# Patient Record
Sex: Male | Born: 1980 | Race: Black or African American | Hispanic: No | Smoking: Never smoker
Health system: Southern US, Community
[De-identification: ages and names within clinical notes are randomized; demographics above are authoritative.]

## PROBLEM LIST (undated history)

## (undated) DIAGNOSIS — E119 Type 2 diabetes mellitus without complications: Secondary | ICD-10-CM

## (undated) DIAGNOSIS — M5416 Radiculopathy, lumbar region: Secondary | ICD-10-CM

## (undated) DIAGNOSIS — I10 Essential (primary) hypertension: Secondary | ICD-10-CM

## (undated) DIAGNOSIS — N189 Chronic kidney disease, unspecified: Secondary | ICD-10-CM

## (undated) HISTORY — DX: Chronic kidney disease, unspecified: N18.9

## (undated) HISTORY — DX: Type 2 diabetes mellitus without complications: E11.9

## (undated) HISTORY — PX: SHOULDER SURGERY: SHX246

## (undated) HISTORY — DX: Essential (primary) hypertension: I10

## (undated) HISTORY — DX: Radiculopathy, lumbar region: M54.16

---

## 2002-03-31 ENCOUNTER — Emergency Department (HOSPITAL_COMMUNITY): Admission: EM | Admit: 2002-03-31 | Discharge: 2002-03-31 | Payer: Self-pay | Admitting: Emergency Medicine

## 2002-03-31 ENCOUNTER — Encounter: Payer: Self-pay | Admitting: Emergency Medicine

## 2014-05-09 ENCOUNTER — Emergency Department: Payer: Self-pay | Admitting: Emergency Medicine

## 2014-05-09 LAB — COMPREHENSIVE METABOLIC PANEL
Albumin: 4.4 g/dL (ref 3.4–5.0)
Alkaline Phosphatase: 110 U/L
Anion Gap: 11 (ref 7–16)
BUN: 18 mg/dL (ref 7–18)
Bilirubin,Total: 0.5 mg/dL (ref 0.2–1.0)
Calcium, Total: 9.4 mg/dL (ref 8.5–10.1)
Chloride: 91 mmol/L — ABNORMAL LOW (ref 98–107)
Co2: 28 mmol/L (ref 21–32)
Creatinine: 1.64 mg/dL — ABNORMAL HIGH (ref 0.60–1.30)
EGFR (African American): >60
EGFR (Non-African Amer.): 55 — ABNORMAL LOW
Glucose: 635 mg/dL (ref 65–99)
Osmolality: 293 (ref 275–301)
Potassium: 4.6 mmol/L (ref 3.5–5.1)
SGOT(AST): 19 U/L (ref 15–37)
SGPT (ALT): 42 U/L
Sodium: 130 mmol/L — ABNORMAL LOW (ref 136–145)
Total Protein: 8.1 g/dL (ref 6.4–8.2)

## 2014-05-09 LAB — URINALYSIS, COMPLETE
Bacteria: NONE SEEN
Bilirubin,UR: NEGATIVE
Blood: NEGATIVE
Glucose,UR: >=500 mg/dL (ref 0–75)
Leukocyte Esterase: NEGATIVE
Nitrite: NEGATIVE
Ph: 5 (ref 4.5–8.0)
Protein: NEGATIVE
RBC,UR: <1 /HPF (ref 0–5)
Specific Gravity: 1.03 (ref 1.003–1.030)
Squamous Epithelial: NONE SEEN
WBC UR: NONE SEEN /HPF (ref 0–5)

## 2014-05-09 LAB — CBC WITH DIFFERENTIAL/PLATELET
Basophil #: 0 10*3/uL (ref 0.0–0.1)
Basophil %: 0.9 %
Eosinophil #: 0 10*3/uL (ref 0.0–0.7)
Eosinophil %: 1.1 %
HCT: 47.4 % (ref 40.0–52.0)
HGB: 16.1 g/dL (ref 13.0–18.0)
Lymphocyte #: 1.6 10*3/uL (ref 1.0–3.6)
Lymphocyte %: 36 %
MCH: 30.1 pg (ref 26.0–34.0)
MCHC: 34 g/dL (ref 32.0–36.0)
MCV: 89 fL (ref 80–100)
Monocyte #: 0.3 x10 3/mm (ref 0.2–1.0)
Monocyte %: 7.6 %
Neutrophil #: 2.4 10*3/uL (ref 1.4–6.5)
Neutrophil %: 54.4 %
Platelet: 182 10*3/uL (ref 150–440)
RBC: 5.34 10*6/uL (ref 4.40–5.90)
RDW: 11.6 % (ref 11.5–14.5)
WBC: 4.5 10*3/uL (ref 3.8–10.6)

## 2014-05-09 LAB — HEMOGLOBIN A1C: Hemoglobin A1C: 12 % — ABNORMAL HIGH (ref 4.2–6.3)

## 2015-03-20 ENCOUNTER — Telehealth: Payer: Self-pay | Admitting: Family Medicine

## 2015-03-20 ENCOUNTER — Other Ambulatory Visit: Payer: Self-pay | Admitting: Family Medicine

## 2015-03-20 NOTE — Telephone Encounter (Signed)
Please contact patient and inform him that the [employer] form is requesting results of lab work done recently. I do not have any lab work results on him. Has he been following with his Endocrinologist for his Diabetes? I have not received any notes from his Endocrinologist. And does he have recent lab work results done through the Endocrinologist that I can review? If not he will need to f/u with me in office so I can order lab work and fill out the [employer] Forms. His last physical was 05/2014 so he can follow up a Pop early as a physical exam if he wants or just a routine follow up is okay as well.

## 2015-03-20 NOTE — Telephone Encounter (Signed)
Contacted patient to inform him that since we do not have any recent lab results on him that we cannot complete the form that his employer is requested. Patient was asked if he has been to his endocrinologist and he said that he has not been there in awhile. Patient was then asked if he wanted to come in for a follow-up or for his annual physical. Patient decided to come in just for a follow-up. Patient was scheduled for this Wednesday (03/22/15) at 9:45am.

## 2015-03-22 ENCOUNTER — Encounter: Payer: Self-pay | Admitting: Family Medicine

## 2015-03-22 ENCOUNTER — Other Ambulatory Visit: Payer: Self-pay | Admitting: Family Medicine

## 2015-03-22 ENCOUNTER — Ambulatory Visit (INDEPENDENT_AMBULATORY_CARE_PROVIDER_SITE_OTHER): Payer: 59 | Admitting: Family Medicine

## 2015-03-22 VITALS — BP 152/90 | HR 79 | Temp 98.2°F | Resp 18 | Ht 69.0 in | Wt 218.0 lb

## 2015-03-22 DIAGNOSIS — E559 Vitamin D deficiency, unspecified: Secondary | ICD-10-CM | POA: Diagnosis not present

## 2015-03-22 DIAGNOSIS — E1029 Type 1 diabetes mellitus with other diabetic kidney complication: Secondary | ICD-10-CM | POA: Insufficient documentation

## 2015-03-22 DIAGNOSIS — N182 Chronic kidney disease, stage 2 (mild): Secondary | ICD-10-CM | POA: Insufficient documentation

## 2015-03-22 DIAGNOSIS — E1065 Type 1 diabetes mellitus with hyperglycemia: Principal | ICD-10-CM

## 2015-03-22 DIAGNOSIS — E1329 Other specified diabetes mellitus with other diabetic kidney complication: Secondary | ICD-10-CM

## 2015-03-22 DIAGNOSIS — E669 Obesity, unspecified: Secondary | ICD-10-CM | POA: Insufficient documentation

## 2015-03-22 DIAGNOSIS — IMO0001 Reserved for inherently not codable concepts without codable children: Secondary | ICD-10-CM

## 2015-03-22 DIAGNOSIS — I10 Essential (primary) hypertension: Secondary | ICD-10-CM | POA: Diagnosis not present

## 2015-03-22 DIAGNOSIS — IMO0002 Reserved for concepts with insufficient information to code with codable children: Secondary | ICD-10-CM

## 2015-03-22 LAB — POCT UA - MICROALBUMIN: Microalbumin Ur, POC: 20 mg/L

## 2015-03-22 LAB — HEMOGLOBIN A1C: HEMOGLOBIN A1C: 9.2 % — AB (ref 4.0–6.0)

## 2015-03-22 LAB — POCT GLYCOSYLATED HEMOGLOBIN (HGB A1C): HEMOGLOBIN A1C: 9.2

## 2015-03-22 MED ORDER — TOUJEO SOLOSTAR 300 UNIT/ML ~~LOC~~ SOPN: 25.0000 [IU] | PEN_INJECTOR | Freq: Every day | SUBCUTANEOUS | Status: DC

## 2015-03-22 MED ORDER — LISINOPRIL 20 MG PO TABS: 20.0000 mg | ORAL_TABLET | Freq: Every day | ORAL | Status: DC

## 2015-03-22 NOTE — Progress Notes (Signed)
Name: William Valdez   MRN: [id number]    DOB: [birth year]   Date:03/22/2015       Progress Note  Subjective  Chief Complaint  Chief Complaint  Patient presents with  . Follow-up    HPI  Patient is here for routine follow up of Diabetes Type I.  Current diabetes medication regimen includesToujeo and Humalog. Patient is taking medications as instructed. Overall the patient feels that their blood glucose is not well controlled. Checking blood glucose 1-2 times a day inconsistently. Has not been following up with Endocrinologist due to bill issues. Fasting glucose recently 150-200mg /dL and supper mealtime glucose 200s.   Hypertension: Patient is here for evaluation of elevated blood pressures.Cardiac symptoms none. Patient denies none.  Cardiovascular risk factors: diabetes mellitus, dyslipidemia, hypertension, male gender, microalbuminuria and obesity (BMI >= 30 kg/m2). Use of agents associated with hypertension: none. History of target organ damage: chronic kidney disease.    Patient Active Problem List   Diagnosis Date Noted  . 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 Obesity, Class II, BMI 35-39.9, with comorbidity 03/22/2015  . CKD (chronic kidney disease), 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  . Vitamin D deficiency 03/22/2015    History  Substance Use Topics  . Smoking status: Never Smoker   . Smokeless tobacco: Not on file  . Alcohol Use: No     Current outpatient prescriptions:  .  ACCU-CHEK AVIVA PLUS test strip, 1 Device by Does not apply route 4 (four) times daily., Disp: , Rfl: 11 .  BD ULTRA-FINE PEN NEEDLES 29G X 12.7MM MISC, 1 Device by Does not apply route 2 (two) times daily., Disp: , Rfl: 2 .  HUMALOG KWIKPEN 100 UNIT/ML KiwkPen, Inject 4 Units into the skin daily., Disp: , Rfl: 11 .  TOUJEO SOLOSTAR 300 UNIT/ML SOPN, Inject 25 Units into the skin daily., Disp: 3 pen, Rfl: 3 .  lisinopril (PRINIVIL,ZESTRIL) 20 MG tablet, Take 1 tablet  (20 mg total) by mouth daily., Disp: 30 tablet, Rfl: 3  History reviewed. No pertinent past surgical history.  Family History  Problem Relation Age of Onset  . Heart disease Mother   . Cancer Mother     breast  . Heart disease Father     No Known Allergies   Review of Systems  CONSTITUTIONAL: No significant weight changes, fever, chills, weakness or fatigue.  HEENT:  - Eyes: No visual changes.  - Ears: No auditory changes. No pain.  - Nose: No sneezing, congestion, runny nose. - Throat: No sore throat. No changes in swallowing. SKIN: No rash or itching.  CARDIOVASCULAR: No chest pain, chest pressure or chest discomfort. No palpitations or edema.  RESPIRATORY: No shortness of breath, cough or sputum.  GASTROINTESTINAL: No anorexia, nausea, vomiting. No changes in bowel habits. No abdominal pain or blood.  GENITOURINARY: No dysuria. No frequency. No discharge.  NEUROLOGICAL: No headache, dizziness, syncope, paralysis, ataxia, numbness or tingling in the extremities. No memory changes. No change in bowel or bladder control.  MUSCULOSKELETAL: No joint pain. No muscle pain. HEMATOLOGIC: No anemia, bleeding or bruising.  LYMPHATICS: No enlarged lymph nodes.  PSYCHIATRIC: No change in mood. No change in sleep pattern.  ENDOCRINOLOGIC: No reports of sweating, cold or heat intolerance. No polyuria or polydipsia.      Objective  BP 152/90 mmHg  Pulse 79  Temp(Src) 98.2 F (36.8 C) (Oral)  Resp 18  Ht  (1.753 m)  Wt 218 lb (98.884 kg)  BMI 32.18  kg/m2  SpO2 98%  Physical Exam  Constitutional: Patient appears well-developed and well-nourished. In no distress.  HEENT:  - Head: Normocephalic and atraumatic.  - Ears: Bilateral TMs gray, no erythema or effusion - Nose: Nasal mucosa moist - Mouth/Throat: Oropharynx is clear and moist. No tonsillar hypertrophy or erythema. No post nasal drainage.  - Eyes: Conjunctivae clear, EOM movements normal. PERRLA. No scleral  icterus.  Neck: Normal range of motion. Neck supple. No JVD present. No thyromegaly present.  Cardiovascular: Normal rate, regular rhythm and normal heart sounds.  No murmur heard.  Pulmonary/Chest: Effort normal and breath sounds normal. No respiratory distress. Musculoskeletal: Normal range of motion bilateral UE and LE, no joint effusions. Peripheral vascular: Bilateral LE no edema. Neurological: CN II-XII grossly intact with no focal deficits. Alert and oriented to person, place, and time. Coordination, balance, strength, speech and gait are normal.  Skin: Skin is warm and dry. No rash noted. No erythema.  Psychiatric: Patient has a normal mood and affect. Behavior is normal in office today. Judgment and thought content normal in office today.   Assessment & Plan  1. DM (diabetes mellitus), type 1, uncontrolled, with renal complications Mr. Yarger has not been following up with the Endocrinologist regarding his DMI. His blood pressure is elevated today. Plan to repeat all blood work and increase Toujeo from 20units to 25units daily, continue Humalog 4-6units with largest meal.   - Lipid panel - POCT UA - Microalbumin - POCT HgB A1C  2. CKD (chronic kidney disease), stage II Needs blood work.  - CBC with Differential/Platelet - COMPLETE METABOLIC PANEL WITH GFR  3. Vitamin D deficiency Needs blood work.  - Vit D  25 hydroxy (rtn osteoporosis monitoring)  4. Hypertension goal BP (blood pressure) < 140/90 New diagnosis. Start ACEi.

## 2015-03-22 NOTE — Patient Instructions (Signed)
Diabetes Mellitus and Food It is important for you to manage your blood sugar (glucose) level. Your blood glucose level can be greatly affected by what you eat. Eating healthier foods in the appropriate amounts throughout the day at about the same time each day will help you control your blood glucose level. It can also help slow or prevent worsening of your diabetes mellitus. Healthy eating may even help you improve the level of your blood pressure and reach or maintain a healthy weight.  HOW CAN FOOD AFFECT ME? Carbohydrates Carbohydrates affect your blood glucose level more than any other type of food. Your dietitian will help you determine how many carbohydrates to eat at each meal and teach you how to count carbohydrates. Counting carbohydrates is important to keep your blood glucose at a healthy level, especially if you are using insulin or taking certain medicines for diabetes mellitus. Alcohol Alcohol can cause sudden decreases in blood glucose (hypoglycemia), especially if you use insulin or take certain medicines for diabetes mellitus. Hypoglycemia can be a life-threatening condition. Symptoms of hypoglycemia (sleepiness, dizziness, and disorientation) are similar to symptoms of having too much alcohol.  If your health care provider has given you approval to drink alcohol, do so in moderation and use the following guidelines:  Women should not have more than one drink per day, and men should not have more than two drinks per day. One drink is equal to:  12 oz of beer.  5 oz of wine.  1 oz of hard liquor.  Do not drink on an empty stomach.  Keep yourself hydrated. Have water, diet soda, or unsweetened iced tea.  Regular soda, juice, and other mixers might contain a lot of carbohydrates and should be counted. WHAT FOODS ARE NOT RECOMMENDED? As you make food choices, it is important to remember that all foods are not the same. Some foods have fewer nutrients per serving than other  foods, even though they might have the same number of calories or carbohydrates. It is difficult to get your body what it needs when you eat foods with fewer nutrients. Examples of foods that you should avoid that are high in calories and carbohydrates but low in nutrients include:  Trans fats (most processed foods list trans fats on the Nutrition Facts label).  Regular soda.  Juice.  Candy.  Sweets, such as cake, pie, doughnuts, and cookies.  Fried foods. WHAT FOODS CAN I EAT? Have nutrient-rich foods, which will nourish your body and keep you healthy. The food you should eat also will depend on several factors, including:  The calories you need.  The medicines you take.  Your weight.  Your blood glucose level.  Your blood pressure level.  Your cholesterol level. You also should eat a variety of foods, including:  Protein, such as meat, poultry, fish, tofu, nuts, and seeds (lean animal proteins are best).  Fruits.  Vegetables.  Dairy products, such as milk, cheese, and yogurt (low fat is best).  Breads, grains, pasta, cereal, rice, and beans.  Fats such as olive oil, trans fat-free margarine, canola oil, avocado, and olives. DOES EVERYONE WITH DIABETES MELLITUS HAVE THE SAME MEAL PLAN? Because every person with diabetes mellitus is different, there is not one meal plan that works for everyone. It is very important that you meet with a dietitian who will help you create a meal plan that is just right for you. Document Released: 06/20/2005 Document Revised: 09/28/2013 Document Reviewed: 08/20/2013 [employer] Patient Information 2015 [employer]. This   information is not intended to replace advice given to you by your health care provider. Make sure you discuss any questions you have with your health care provider.  

## 2015-03-24 ENCOUNTER — Other Ambulatory Visit: Payer: Self-pay | Admitting: Family Medicine

## 2015-03-24 ENCOUNTER — Encounter: Payer: Self-pay | Admitting: Family Medicine

## 2015-03-24 DIAGNOSIS — T464X5A Adverse effect of angiotensin-converting-enzyme inhibitors, initial encounter: Principal | ICD-10-CM

## 2015-03-24 DIAGNOSIS — I10 Essential (primary) hypertension: Secondary | ICD-10-CM | POA: Insufficient documentation

## 2015-03-24 DIAGNOSIS — R05 Cough: Secondary | ICD-10-CM

## 2015-03-24 DIAGNOSIS — D709 Neutropenia, unspecified: Secondary | ICD-10-CM

## 2015-03-24 DIAGNOSIS — R7989 Other specified abnormal findings of blood chemistry: Secondary | ICD-10-CM | POA: Insufficient documentation

## 2015-03-24 HISTORY — DX: Essential (primary) hypertension: I10

## 2015-03-24 LAB — COMPREHENSIVE METABOLIC PANEL
ALBUMIN: 4.3 g/dL (ref 3.5–5.5)
ALT: 34 IU/L (ref 0–44)
AST: 23 IU/L (ref 0–40)
Albumin/Globulin Ratio: 1.7 (ref 1.1–2.5)
Alkaline Phosphatase: 78 IU/L (ref 39–117)
BILIRUBIN TOTAL: 0.4 mg/dL (ref 0.0–1.2)
BUN/Creatinine Ratio: 8 (ref 8–19)
BUN: 10 mg/dL (ref 6–20)
CO2: 25 mmol/L (ref 18–29)
Calcium: 9.3 mg/dL (ref 8.7–10.2)
Chloride: 99 mmol/L (ref 97–108)
Creatinine, Ser: 1.22 mg/dL (ref 0.76–1.27)
GFR, EST AFRICAN AMERICAN: 89 mL/min/{1.73_m2} (ref >59)
GFR, EST NON AFRICAN AMERICAN: 77 mL/min/{1.73_m2} (ref >59)
GLOBULIN, TOTAL: 2.5 g/dL (ref 1.5–4.5)
GLUCOSE: 151 mg/dL — AB (ref 65–99)
Potassium: 4.1 mmol/L (ref 3.5–5.2)
Sodium: 138 mmol/L (ref 134–144)
TOTAL PROTEIN: 6.8 g/dL (ref 6.0–8.5)

## 2015-03-24 LAB — CBC WITH DIFFERENTIAL/PLATELET
Basophils Absolute: 0 10*3/uL (ref 0.0–0.2)
Basos: 0 %
EOS (ABSOLUTE): 0.1 10*3/uL (ref 0.0–0.4)
Eos: 3 %
Hematocrit: 44.2 % (ref 37.5–51.0)
Hemoglobin: 15 g/dL (ref 12.6–17.7)
Immature Grans (Abs): 0 10*3/uL (ref 0.0–0.1)
Immature Granulocytes: 0 %
Lymphocytes Absolute: 1.2 10*3/uL (ref 0.7–3.1)
Lymphs: 47 %
MCH: 29.2 pg (ref 26.6–33.0)
MCHC: 33.9 g/dL (ref 31.5–35.7)
MCV: 86 fL (ref 79–97)
Monocytes Absolute: 0.2 10*3/uL (ref 0.1–0.9)
Monocytes: 7 %
Neutrophils Absolute: 1.2 10*3/uL — ABNORMAL LOW (ref 1.4–7.0)
Neutrophils: 43 %
Platelets: 192 10*3/uL (ref 150–379)
RBC: 5.13 x10E6/uL (ref 4.14–5.80)
RDW: 12.5 % (ref 12.3–15.4)
WBC: 2.7 10*3/uL — ABNORMAL LOW (ref 3.4–10.8)

## 2015-03-24 LAB — LIPID PANEL
CHOLESTEROL TOTAL: 109 mg/dL (ref 100–199)
Chol/HDL Ratio: 2.1 ratio units (ref 0.0–5.0)
HDL: 51 mg/dL (ref >39)
LDL Calculated: 46 mg/dL (ref 0–99)
TRIGLYCERIDES: 62 mg/dL (ref 0–149)
VLDL Cholesterol Cal: 12 mg/dL (ref 5–40)

## 2015-03-24 LAB — VITAMIN D 25 HYDROXY (VIT D DEFICIENCY, FRACTURES): VIT D 25 HYDROXY: 17 ng/mL — AB (ref 30.0–100.0)

## 2015-03-24 MED ORDER — LOSARTAN POTASSIUM 50 MG PO TABS: 50.0000 mg | ORAL_TABLET | Freq: Every day | ORAL | Status: DC

## 2015-03-24 MED ORDER — VITAMIN D (ERGOCALCIFEROL) 1.25 MG (50000 UNIT) PO CAPS
50000.0000 [IU] | ORAL_CAPSULE | ORAL | Status: DC
Start: 2015-03-24 — End: 2016-03-25

## 2015-03-24 NOTE — Progress Notes (Signed)
Discontinued Lisinopril and sent in RX for Losartan 50mg  one a day with 3 refills.

## 2015-04-19 ENCOUNTER — Other Ambulatory Visit: Payer: Self-pay | Admitting: Family Medicine

## 2015-04-19 DIAGNOSIS — IMO0002 Reserved for concepts with insufficient information to code with codable children: Secondary | ICD-10-CM

## 2015-04-19 DIAGNOSIS — E1165 Type 2 diabetes mellitus with hyperglycemia: Secondary | ICD-10-CM

## 2015-06-21 ENCOUNTER — Telehealth: Payer: Self-pay | Admitting: Family Medicine

## 2015-06-21 ENCOUNTER — Ambulatory Visit: Payer: 59 | Admitting: Family Medicine

## 2015-06-21 NOTE — Telephone Encounter (Signed)
Please speak with William Valdez and let him know that he missed his appointment for Diabetes, HTN follow up. Has he seen his Endocrinologist instead? If not he really needs to see me for his diabetes management and HTN as I did start him on new medication at our last visit.

## 2015-06-21 NOTE — Telephone Encounter (Signed)
Patient stated that he has been calling all week and could not get through. Patient was rescheduled for this Friday (06/23/15) at 3:15pm.

## 2015-06-23 ENCOUNTER — Encounter: Payer: Self-pay | Admitting: Family Medicine

## 2015-06-23 ENCOUNTER — Ambulatory Visit (INDEPENDENT_AMBULATORY_CARE_PROVIDER_SITE_OTHER): Payer: 59 | Admitting: Family Medicine

## 2015-06-23 VITALS — BP 152/110 | HR 88 | Temp 98.7°F | Resp 16 | Wt 206.6 lb

## 2015-06-23 DIAGNOSIS — E669 Obesity, unspecified: Secondary | ICD-10-CM | POA: Diagnosis not present

## 2015-06-23 DIAGNOSIS — Z9119 Patient's noncompliance with other medical treatment and regimen: Secondary | ICD-10-CM

## 2015-06-23 DIAGNOSIS — E1029 Type 1 diabetes mellitus with other diabetic kidney complication: Secondary | ICD-10-CM | POA: Diagnosis not present

## 2015-06-23 DIAGNOSIS — E1065 Type 1 diabetes mellitus with hyperglycemia: Principal | ICD-10-CM

## 2015-06-23 DIAGNOSIS — Z91199 Patient's noncompliance with other medical treatment and regimen due to unspecified reason: Secondary | ICD-10-CM

## 2015-06-23 DIAGNOSIS — I10 Essential (primary) hypertension: Secondary | ICD-10-CM | POA: Diagnosis not present

## 2015-06-23 DIAGNOSIS — Z23 Encounter for immunization: Secondary | ICD-10-CM

## 2015-06-23 DIAGNOSIS — E66811 Obesity, class 1: Secondary | ICD-10-CM

## 2015-06-23 DIAGNOSIS — IMO0002 Reserved for concepts with insufficient information to code with codable children: Secondary | ICD-10-CM | POA: Insufficient documentation

## 2015-06-23 LAB — POCT UA - MICROALBUMIN: MICROALBUMIN (UR) POC: 50 mg/L

## 2015-06-23 LAB — POCT GLYCOSYLATED HEMOGLOBIN (HGB A1C): HEMOGLOBIN A1C: 10.2

## 2015-06-23 MED ORDER — LOSARTAN POTASSIUM-HCTZ 100-25 MG PO TABS: 1.0000 | ORAL_TABLET | Freq: Every day | ORAL | Status: DC

## 2015-06-23 MED ORDER — TOUJEO SOLOSTAR 300 UNIT/ML ~~LOC~~ SOPN: 30.0000 [IU] | PEN_INJECTOR | Freq: Every day | SUBCUTANEOUS | Status: DC

## 2015-06-23 NOTE — Progress Notes (Signed)
Name: William Valdez   MRN: [id number]    DOB: 20-Apr-1981   Date:06/23/2015       Progress Note  Subjective  Chief Complaint  Chief Complaint  Patient presents with  . Hypertension  . Diabetes    HPI   William Valdez is a 34 year old male here today for routine follow up of Obesity, Diabetes Type I, HTN. He was first diagnosed with Diabetes Type I just 1 year ago and has had a hard time with consistent glycemic control due to compliance with diet, lifestyle and administering his insulin as directed.  He is currently prescribed Toujeo 26 units daily which he takes consistently and Humalog 10 units with each meal which he takes inconsistently. He work for [employer] and when he is out of the office he has a hard time eating diabetic friendly meals and does find it inconvinient to remember to take his Humalog. He does however administer the Humalog with dinner more regularly as he is home. He also notes that if he has a chance to prepare his own meals his sugars are better controled. Current range of glucose readings he reports is 115 to 250.  Related symptoms include fatigue, occasional poor memory and do not include paresthesia, polydipsia, polyuria, wounds, ulcers and gastroparesis. Last diabetic eye exam was earlier this year patient reports.  For his HTN he is taking Losartan 50 mg one a day with no reported chest pain, palpitations, edema. Last FLP was done on 03/23/15 and showed no hyperlipidemia.    Active Ambulatory Problems    Diagnosis Date Noted  . 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 Obesity, Class I, BMI 30.0-34.9 (see actual BMI) 03/22/2015  . CKD (chronic kidney disease), 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  . Vitamin D deficiency 03/22/2015  . Neutropenia 03/24/2015  . 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 Hypertension goal BP (blood pressure) < 140/90 03/24/2015  . DM (diabetes mellitus), type 1,  uncontrolled, with renal complications 06/23/2015   Resolved Ambulatory Problems    Diagnosis Date Noted  . Low serum vitamin D 03/24/2015   Past Medical History  Diagnosis Date  . Diabetes mellitus without complication   . Chronic kidney disease     Social History  Substance Use Topics  . Smoking status: Never Smoker   . Smokeless tobacco: Not on file  . Alcohol Use: No     Current outpatient prescriptions:  .  ACCU-CHEK AVIVA PLUS test strip, 1 Device by Does not apply route 4 (four) times daily., Disp: , Rfl: 11 .  BD ULTRA-FINE PEN NEEDLES 29G X 12.7MM MISC, USE AS DIRECTED TO INJECT INSULIN 2X/DAY, Disp: 180 each, Rfl: 3 .  HUMALOG KWIKPEN 100 UNIT/ML KiwkPen, Inject 4 Units into the skin daily., Disp: , Rfl: 11 .  losartan (COZAAR) 50 MG tablet, Take 1 tablet (50 mg total) by mouth daily., Disp: 30 tablet, Rfl: 3 .  TOUJEO SOLOSTAR 300 UNIT/ML SOPN, Inject 25 Units into the skin daily., Disp: 3 pen, Rfl: 3 .  Vitamin D, Ergocalciferol, (DRISDOL) 50000 UNITS CAPS capsule, Take 1 capsule (50,000 Units total) by mouth every 7 (seven) days., Disp: 12 capsule, Rfl: 0  No past surgical history on file.  Family History  Problem Relation Age of Onset  . Heart disease Mother   . Cancer Mother     breast  . Heart disease Father     No Known Allergies   Review of  Systems  CONSTITUTIONAL: No significant weight changes, fever, chills, weakness or fatigue.  HEENT:  - Eyes: No visual changes.  - Ears: No auditory changes. No pain.  - Nose: No sneezing, congestion, runny nose. - Throat: No sore throat. No changes in swallowing. SKIN: No rash or itching.  CARDIOVASCULAR: No chest pain, chest pressure or chest discomfort. No palpitations or edema.  RESPIRATORY: No shortness of breath, cough or sputum.  GASTROINTESTINAL: No anorexia, nausea, vomiting. No changes in bowel habits. No abdominal pain or blood.  GENITOURINARY: No dysuria. No frequency. No discharge.  NEUROLOGICAL:  No headache, dizziness, syncope, paralysis, ataxia, numbness or tingling in the extremities. No persistent memory changes. No change in bowel or bladder control.  MUSCULOSKELETAL: No joint pain. No muscle pain. HEMATOLOGIC: No anemia, bleeding or bruising.  LYMPHATICS: No enlarged lymph nodes.  PSYCHIATRIC: No change in mood. No change in sleep pattern.  ENDOCRINOLOGIC: No reports of sweating, cold or heat intolerance. No polyuria or polydipsia.     Objective  BP 152/110 mmHg  Pulse 88  Temp(Src) 98.7 F (37.1 C) (Oral)  Resp 16  Wt 206 lb 9.6 oz (93.713 kg)  SpO2 98% Body mass index is 30.5 kg/(m^2).  Physical Exam  Constitutional: Patient overweight and well-nourished. In no distress.  HEENT:  - Head: Normocephalic and atraumatic.  - Ears: Bilateral TMs gray, no erythema or effusion - Nose: Nasal mucosa moist - Mouth/Throat: Oropharynx is clear and moist. No tonsillar hypertrophy or erythema. No post nasal drainage.  - Eyes: Conjunctivae clear, EOM movements normal. PERRLA. No scleral icterus.  Neck: Normal range of motion. Neck supple. No JVD present. No thyromegaly present.  Cardiovascular: Normal rate, regular rhythm and normal heart sounds.  No murmur heard.  Pulmonary/Chest: Effort normal and breath sounds normal. No respiratory distress. Musculoskeletal: Normal range of motion bilateral UE and LE, no joint effusions. Peripheral vascular: Bilateral LE no edema. Neurological: CN II-XII grossly intact with no focal deficits. Alert and oriented to person, place, and time. Coordination, balance, strength, speech and gait are normal.  Skin: Skin is warm and dry. No rash noted. No erythema.  Psychiatric: Patient has a normal mood and affect. Behavior is normal in office today. Judgment and thought content normal in office today.   Recent Results (from the past 2160 hour(s))  POCT glycosylated hemoglobin (Hb A1C)     Status: Abnormal   Collection Time: 06/23/15  4:04 PM   Result Value Ref Range   Hemoglobin A1C 10.2   POCT UA - Microalbumin     Status: Abnormal   Collection Time: 06/23/15  4:04 PM  Result Value Ref Range   Microalbumin Ur, POC 50 mg/L   Creatinine, POC  mg/dL   Albumin/Creatinine Ratio, Urine, POC       Assessment & Plan  1. DM (diabetes mellitus), type 1, uncontrolled, with renal complications Sub optimal control. Plan to increase Toujeo to 30 units daily and Humalog to 6-10 units with breakfast, 10-12 units lunch, 10-15 units with dinner. Work on diet and medication compliance.  Patient's Hba1c goal is <6.5% for stringent control.  Encouraged patient to continue efforts on checking blood glucose on a daily basis. Fasting blood glucose control goal is 80-130mg /dL and post prandial blood glucose control is 180mg /dL.  Reviewed diet, exercise, lifestyle changes and current medication regimen pertaining to diabetes with the patient.   Reminded patient of the required annual dilated retinal exam.   - TOUJEO SOLOSTAR 300 UNIT/ML SOPN; Inject 30 Units into the  skin daily.  Dispense: 6 pen; Refill: 3  2. Noncompliance The patient has been counseled on the importance of the proper and regular use of their medication(s) so as to achieve treatment goals. Patient verbalizes understanding that nonadhering to recommended treatment plan can result in adverse events.  - POCT glycosylated hemoglobin (Hb A1C) - POCT UA - Microalbumin  3. Hypertension goal BP (blood pressure) < 140/90 Sub optimal control. Increased Losartan to 100 mg a day and added HCTZ 25 mg a day.   - losartan-hydrochlorothiazide (HYZAAR) 100-25 MG per tablet; Take 1 tablet by mouth daily.  Dispense: 90 tablet; Refill: 3  4. Obesity, Class I, BMI 30.0-34.9 (see actual BMI) He has lost weight. Continue with regular exercise and moderate diet.   5. Need for immunization against influenza  - Flu Vaccine Quad 36+ mos IM

## 2015-06-24 DIAGNOSIS — Z23 Encounter for immunization: Secondary | ICD-10-CM | POA: Insufficient documentation

## 2015-08-23 ENCOUNTER — Other Ambulatory Visit: Payer: Self-pay | Admitting: Family Medicine

## 2015-08-23 ENCOUNTER — Ambulatory Visit (INDEPENDENT_AMBULATORY_CARE_PROVIDER_SITE_OTHER): Payer: 59 | Admitting: Family Medicine

## 2015-08-23 ENCOUNTER — Encounter: Payer: Self-pay | Admitting: Family Medicine

## 2015-08-23 VITALS — BP 122/80 | HR 80 | Temp 98.0°F | Resp 16 | Ht 69.0 in | Wt 197.6 lb

## 2015-08-23 DIAGNOSIS — N182 Chronic kidney disease, stage 2 (mild): Secondary | ICD-10-CM

## 2015-08-23 DIAGNOSIS — Z9114 Patient's other noncompliance with medication regimen: Secondary | ICD-10-CM | POA: Insufficient documentation

## 2015-08-23 DIAGNOSIS — IMO0002 Reserved for concepts with insufficient information to code with codable children: Secondary | ICD-10-CM

## 2015-08-23 DIAGNOSIS — E1065 Type 1 diabetes mellitus with hyperglycemia: Secondary | ICD-10-CM | POA: Diagnosis not present

## 2015-08-23 DIAGNOSIS — E1022 Type 1 diabetes mellitus with diabetic chronic kidney disease: Secondary | ICD-10-CM | POA: Diagnosis not present

## 2015-08-23 MED ORDER — TOUJEO SOLOSTAR 300 UNIT/ML ~~LOC~~ SOPN: 30.0000 [IU] | PEN_INJECTOR | Freq: Every day | SUBCUTANEOUS | Status: DC

## 2015-08-23 MED ORDER — HUMALOG KWIKPEN 100 UNIT/ML ~~LOC~~ SOPN: 4.0000 [IU] | PEN_INJECTOR | Freq: Every day | SUBCUTANEOUS | Status: DC

## 2015-08-23 NOTE — Progress Notes (Signed)
Name: William Valdez   MRN: [id number]    DOB: 1981-07-09   Date:08/23/2015       Progress Note  Subjective  Chief Complaint  Chief Complaint  Patient presents with  . Follow-up    Patient needs refill of humalog kwikpen    HPI  William Valdez is a 34 year old male here today for routine follow up of Obesity, Diabetes Type I, HTN. He was first diagnosed with Diabetes Type I just 1 year ago and has had a hard time with consistent glycemic control due to compliance with diet, lifestyle and administering his insulin as directed. At our last visit his insulin regimen was changed:  Toujeo to 30 units daily and Humalog to 6-10 units with breakfast, 10-12 units lunch, 10-15 units with dinner. Work on diet and medication compliance.  TODAY HE INFORMS ME THAT HE HAS BEEN OUT OF HUMALOG FOR 1 MONTH AND HIS SUGARS ARE RANGING IN THE 300S, LOWEST 125, INCREASED URINATION.  He work for [employer] and when he is out of the office he has a hard time eating diabetic friendly meals and does find it inconvinient to remember to take his Humalog. He does however administer the Humalog with dinner more regularly as he is home. He also notes that if he has a chance to prepare his own meals his sugars are better controled. Related symptomsinclude fatigue, occasional poor memory and do not include paresthesia, polydipsia, wounds, ulcers and gastroparesis. Last diabetic eye exam was earlier this year patient reports. For his HTN he is taking Losartan 50 mg one a day with no reported chest pain, palpitations, edema. Last FLP was done on 03/23/15 and showed no hyperlipidemia.   Past Medical History  Diagnosis Date  . Diabetes mellitus without complication (HCC)   . Chronic kidney disease   . Hypertension goal BP (blood pressure) < 140/90 03/24/2015    Patient Active Problem List   Diagnosis Date Noted  . Need for immunization against influenza 06/24/2015  . DM (diabetes mellitus), type 1, uncontrolled, with  renal complications (HCC) 06/23/2015  . Neutropenia (HCC) 03/24/2015  . 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 Hypertension goal BP (blood pressure) < 140/90 03/24/2015  . DM (diabetes mellitus) type I uncontrolled with renal manifestation (HCC) 03/22/2015  . Obesity, Class I, BMI 30.0-34.9 (see actual BMI) 03/22/2015  . CKD (chronic kidney disease), 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  . Vitamin D deficiency 03/22/2015    Social History  Substance Use Topics  . Smoking status: Never Smoker   . Smokeless tobacco: Not on file  . Alcohol Use: No     Current outpatient prescriptions:  .  ACCU-CHEK AVIVA PLUS test strip, 1 Device by Does not apply route 4 (four) times daily., Disp: , Rfl: 11 .  BD ULTRA-FINE PEN NEEDLES 29G X 12.7MM MISC, USE AS DIRECTED TO INJECT INSULIN 2X/DAY, Disp: 180 each, Rfl: 3 .  HUMALOG KWIKPEN 100 UNIT/ML KiwkPen, Inject 4 Units into the skin daily., Disp: , Rfl: 11 .  losartan-hydrochlorothiazide (HYZAAR) 100-25 MG per tablet, Take 1 tablet by mouth daily., Disp: 90 tablet, Rfl: 3 .  TOUJEO SOLOSTAR 300 UNIT/ML SOPN, Inject 30 Units into the skin daily., Disp: 6 pen, Rfl: 3 .  Vitamin D, Ergocalciferol, (DRISDOL) 50000 UNITS CAPS capsule, Take 1 capsule (50,000 Units total) by mouth every 7 (seven) days., Disp: 12 capsule, Rfl: 0  History reviewed. No pertinent past surgical history.  Family History  Problem Relation Age of  Onset  . Heart disease Mother   . Cancer Mother     breast  . Heart disease Father     No Known Allergies   Review of Systems  CONSTITUTIONAL: No significant weight changes, fever, chills, weakness or fatigue.  CARDIOVASCULAR: No chest pain, chest pressure or chest discomfort. No palpitations or edema.  RESPIRATORY: No shortness of breath, cough or sputum.  GASTROINTESTINAL: No anorexia, nausea, vomiting. No changes in bowel habits. No abdominal pain or blood.  GENITOURINARY: No dysuria. Yes increased  frequency. No discharge. NEUROLOGICAL: No headache, dizziness, syncope, paralysis, ataxia, numbness or tingling in the extremities. No memory changes. No change in bowel or bladder control.  ENDOCRINOLOGIC: No reports of sweating, cold or heat intolerance. Yes polyuria.    Objective  BP 122/80 mmHg  Pulse 80  Temp(Src) 98 F (36.7 C) (Oral)  Resp 16  Ht  (1.753 m)  Wt 197 lb 9.6 oz (89.631 kg)  BMI 29.17 kg/m2  SpO2 98% Body mass index is 29.17 kg/(m^2).  Physical Exam  Constitutional: Patient appears well-developed and well-nourished. In no distress.  Cardiovascular: Normal rate, regular rhythm and normal heart sounds.  No murmur heard.  Pulmonary/Chest: Effort normal and breath sounds normal. No respiratory distress. Musculoskeletal: Normal range of motion bilateral UE and LE, no joint effusions. Peripheral vascular: Bilateral LE no edema. Neurological: CN II-XII grossly intact with no focal deficits. Alert and oriented to person, place, and time. Coordination, balance, strength, speech and gait are normal.  Skin: Skin is warm and dry. No rash noted. No erythema.  Psychiatric: Patient has a normal mood and affect. Behavior is normal in office today. Judgment and thought content normal in office today.   Recent Results (from the past 2160 hour(s))  POCT glycosylated hemoglobin (Hb A1C)     Status: Abnormal   Collection Time: 06/23/15  4:04 PM  Result Value Ref Range   Hemoglobin A1C 10.2   POCT UA - Microalbumin     Status: Abnormal   Collection Time: 06/23/15  4:04 PM  Result Value Ref Range   Microalbumin Ur, POC 50 mg/L   Creatinine, POC  mg/dL   Albumin/Creatinine Ratio, Urine, POC       Assessment & Plan   1. 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 I am not clear on how he "ran out" of Humalog as I had sent plenty of  refills at our last visit and he could have also made an effort to call my office requesting refills. I am suspecting this may be a financial issue. I gave him a sample pen of Toujeo today and sent in refills of both humalog and toujeo.   - HUMALOG KWIKPEN 100 UNIT/ML KiwkPen; Inject 0.04 mLs (4 Units total) into the skin daily.  Dispense: 15 mL; Refill: 11 - TOUJEO SOLOSTAR 300 UNIT/ML SOPN; Inject 30 Units into the skin daily.  Dispense: 6 pen; Refill: 3  2. Noncompliance w/medication treatment due to intermit use of medication I have counseled him on the long term serious and perhaps irreversible consequences of inconsistent use of his insulin.

## 2015-10-25 ENCOUNTER — Ambulatory Visit
Admission: RE | Admit: 2015-10-25 | Discharge: 2015-10-25 | Disposition: A | Discharge status: Home / Self Care | Payer: Managed Care, Other (non HMO) | Source: Ambulatory Visit | Attending: Family Medicine | Admitting: Family Medicine

## 2015-10-25 ENCOUNTER — Encounter: Payer: Self-pay | Admitting: Family Medicine

## 2015-10-25 ENCOUNTER — Ambulatory Visit (INDEPENDENT_AMBULATORY_CARE_PROVIDER_SITE_OTHER): Payer: 59 | Admitting: Family Medicine

## 2015-10-25 VITALS — BP 138/92 | HR 86 | Temp 98.6°F | Resp 14 | Ht 69.0 in | Wt 195.8 lb

## 2015-10-25 DIAGNOSIS — N181 Chronic kidney disease, stage 1: Secondary | ICD-10-CM

## 2015-10-25 DIAGNOSIS — M25511 Pain in right shoulder: Secondary | ICD-10-CM | POA: Insufficient documentation

## 2015-10-25 DIAGNOSIS — Z9114 Patient's other noncompliance with medication regimen: Secondary | ICD-10-CM

## 2015-10-25 DIAGNOSIS — L700 Acne vulgaris: Secondary | ICD-10-CM

## 2015-10-25 DIAGNOSIS — E1065 Type 1 diabetes mellitus with hyperglycemia: Secondary | ICD-10-CM

## 2015-10-25 DIAGNOSIS — IMO0002 Reserved for concepts with insufficient information to code with codable children: Secondary | ICD-10-CM

## 2015-10-25 DIAGNOSIS — Z91148 Patient's other noncompliance with medication regimen for other reason: Secondary | ICD-10-CM

## 2015-10-25 DIAGNOSIS — I10 Essential (primary) hypertension: Secondary | ICD-10-CM | POA: Diagnosis not present

## 2015-10-25 DIAGNOSIS — E1022 Type 1 diabetes mellitus with diabetic chronic kidney disease: Secondary | ICD-10-CM | POA: Diagnosis not present

## 2015-10-25 DIAGNOSIS — N182 Chronic kidney disease, stage 2 (mild): Secondary | ICD-10-CM | POA: Diagnosis not present

## 2015-10-25 LAB — POCT GLYCOSYLATED HEMOGLOBIN (HGB A1C): Hemoglobin A1C: 14

## 2015-10-25 MED ORDER — MELOXICAM 15 MG PO TABS: 15.0000 mg | ORAL_TABLET | Freq: Every day | ORAL | Status: DC | PRN

## 2015-10-25 NOTE — Progress Notes (Signed)
Name: William Valdez   MRN: [id number]    DOB: 07/18/1981   Date:10/25/2015       Progress Note  Subjective  Chief Complaint  Chief Complaint  Patient presents with  . Medication Refill    2 month f/u  . Diabetes    checks glucose 1x day low-100's high-350  . Hypertension  . Shoulder Pain    Right onset 2 months, unknown trauma and worsening  . Acne    new onset    HPI  William Valdez is a 35 year old male here today for routine follow up of Obesity, Diabetes Type I, HTN. He was first diagnosed with Diabetes Type I in 2016 and has had a hard time with consistent glycemic control due to compliance with diet, lifestyle and administering his insulin as directed.   Toujeo 30 units daily and Humalog to 6-10 units with breakfast, 10-12 units lunch, 10-15 units with dinner.   William Valdez has not been able to consistently give himself his insulin yet again. He is missing doses of Toujeo and sometimes even Humalog. Due to cost of medication and general work lifestyle.   Hba1c is >14% today.  He work for [employer] and when he is out of the office he has a hard time eating diabetic friendly meals and does find it inconvinient to remember to take his Humalog. He does however administer the Humalog with dinner more regularly as he is home. He also notes that if he has a chance to prepare his own meals his sugars are better controled. Related symptomsinclude fatigue, occasional poor memory and do not include paresthesia, polydipsia, wounds, ulcers and gastroparesis. Last diabetic eye exam was earlier this year patient reports. For his HTN he is taking Losartan 50 mg one a day with no reported chest pain, palpitations, edema. Last FLP was done on 03/23/15 and showed no hyperlipidemia.   Today he also complains of acne break out for the past month. Located forehead and back. Relieved mildly with OTC acne medication. New right shoulder pain for 2 months. Associated with restricted ROM and limiting his  usual gym workout activity. No trauma or event to cause this. He does note a family history of dislocated shoulders and rotator issues.   Past Medical History  Diagnosis Date  . Diabetes mellitus without complication (HCC)   . Chronic kidney disease   . Hypertension goal BP (blood pressure) < 140/90 03/24/2015    Patient Active Problem List   Diagnosis Date Noted  . Noncompliance w/medication treatment due to intermit use of medication 08/23/2015  . DM (diabetes mellitus), type 1, uncontrolled, with renal complications (HCC) 06/23/2015  . Neutropenia (HCC) 03/24/2015  . 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 Hypertension goal BP (blood pressure) < 140/90 03/24/2015  . Obesity, Class I, BMI 30.0-34.9 (see actual BMI) 03/22/2015  . CKD (chronic kidney disease), 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  . Vitamin D deficiency 03/22/2015    Social History  Substance Use Topics  . Smoking status: Never Smoker   . Smokeless tobacco: Not on file  . Alcohol Use: No     Current outpatient prescriptions:  .  ACCU-CHEK AVIVA PLUS test strip, 1 Device by Does not apply route 4 (four) times daily., Disp: , Rfl: 11 .  BD ULTRA-FINE PEN NEEDLES 29G X 12.7MM MISC, USE AS DIRECTED TO INJECT INSULIN 2X/DAY, Disp: 180 each, Rfl: 3 .  insulin lispro (HUMALOG KWIKPEN) 100 UNIT/ML KiwkPen, Inject 0.15 mLs (15 Units total)  into the skin 3 (three) times daily., Disp: 5 pen, Rfl: 11 .  losartan-hydrochlorothiazide (HYZAAR) 100-25 MG per tablet, Take 1 tablet by mouth daily., Disp: 90 tablet, Rfl: 3 .  TOUJEO SOLOSTAR 300 UNIT/ML SOPN, Inject 30 Units into the skin daily., Disp: 6 pen, Rfl: 3 .  Vitamin D, Ergocalciferol, (DRISDOL) 50000 UNITS CAPS capsule, Take 1 capsule (50,000 Units total) by mouth every 7 (seven) days., Disp: 12 capsule, Rfl: 0  No past surgical history on file.  Family History  Problem Relation Age of Onset  . Heart disease Mother   . Cancer Mother      breast  . Heart disease Father     No Known Allergies   Review of Systems  CONSTITUTIONAL: No significant weight changes, fever, chills, weakness or fatigue.  HEENT:  - Eyes: No visual changes.  - Ears: No auditory changes. No pain.  - Nose: No sneezing, congestion, runny nose. - Throat: No sore throat. No changes in swallowing. SKIN: Yes acne. CARDIOVASCULAR: No chest pain, chest pressure or chest discomfort. No palpitations or edema.  RESPIRATORY: No shortness of breath, cough or sputum.  GASTROINTESTINAL: No anorexia, nausea, vomiting. No changes in bowel habits. No abdominal pain or blood.  NEUROLOGICAL: No headache, dizziness, syncope, paralysis, ataxia, numbness or tingling in the extremities. No memory changes. No change in bowel or bladder control.  MUSCULOSKELETAL: Yes joint pain. No muscle pain. HEMATOLOGIC: No anemia, bleeding or bruising.  LYMPHATICS: No enlarged lymph nodes.  PSYCHIATRIC: No change in mood. No change in sleep pattern.  ENDOCRINOLOGIC: No reports of sweating, cold or heat intolerance. No polyuria or polydipsia.     Objective  BP 138/92 mmHg  Pulse 86  Temp(Src) 98.6 F (37 C) (Oral)  Resp 14  Ht  (1.753 m)  Wt 195 lb 12.8 oz (88.814 kg)  BMI 28.90 kg/m2  SpO2 96% Body mass index is 28.9 kg/(m^2).  Physical Exam  Constitutional: Patient appears well-developed and well-nourished. In no distress.  HEENT:  - Head: Normocephalic and atraumatic.  - Ears: Bilateral TMs gray, no erythema or effusion - Nose: Nasal mucosa moist - Mouth/Throat: Oropharynx is clear and moist. No tonsillar hypertrophy or erythema. No post nasal drainage.  - Eyes: Conjunctivae clear, EOM movements normal. PERRLA. No scleral icterus.  Neck: Normal range of motion. Neck supple. No JVD present. No thyromegaly present.  Cardiovascular: Normal rate, regular rhythm and normal heart sounds.  No murmur heard.  Pulmonary/Chest: Effort normal and breath sounds normal. No  respiratory distress. Musculoskeletal: Normal range of motion bilateral UE and LE, no joint effusions. Normal ROM of left shoulder. Right shoulder abduction restricted above 100 degrees, negative empty can testing, positive lift off testing. Some laxity in right shoulder joint noted w/o overt dislocation.  Peripheral vascular: Bilateral LE no edema. Neurological: CN II-XII grossly intact with no focal deficits. Alert and oriented to person, place, and time. Coordination, balance, strength, speech and gait are normal.  Skin: Skin is warm and dry. Scattered acne papules and hyperpigmented scars on forehead and upper back.  Psychiatric: Patient has a stable mood and affect. Behavior is normal in office today. Judgment and thought content normal in office today.  Results for orders placed or performed in visit on 10/25/15 (from the past 24 hour(s))  POCT HgB A1C     Status: Abnormal   Collection Time: 10/25/15  8:18 AM  Result Value Ref Range   Hemoglobin A1C 14.0     Assessment & Plan  1. Uncontrolled type 1 diabetes mellitus with stage 2 chronic kidney disease (HCC) Referred to Endocrinology for Hba1c 14% unable to follow regimen recommended by PCP needs more DM I education, carb counting, possible consultation for pump.   - POCT HgB A1C - Ambulatory referral to Endocrinology  2. Noncompliance w/medication treatment due to intermit use of medication The patient has been counseled on the importance of the proper and regular use of their medication(s) so as to achieve treatment goals. Patient verbalizes understanding that nonadhering to recommended treatment plan can result in adverse events.   3. Hypertension goal BP (blood pressure) < 140/90 Diastolic elevated today but likely due to poor glycemic control. Monitor.   4. Right shoulder pain Start with X-ray, PT. May be bursitis, capsulitis. Start Meloxicam daily and then wean down to PRN use. If symptoms persist despite this will refer to  Orthopedics.   - DG Shoulder Right; Future - meloxicam (MOBIC) 15 MG tablet; Take 1 tablet (15 mg total) by mouth daily as needed for pain.  Dispense: 30 tablet; Refill: 3 - Ambulatory referral to Physical Therapy  5. Acne vulgaris Likely due to hyperglycemia, control sugars first.

## 2015-11-25 ENCOUNTER — Other Ambulatory Visit: Payer: Self-pay | Admitting: Family Medicine

## 2015-11-28 ENCOUNTER — Ambulatory Visit: Payer: PRIVATE HEALTH INSURANCE

## 2015-12-07 ENCOUNTER — Ambulatory Visit: Payer: PRIVATE HEALTH INSURANCE

## 2016-01-09 ENCOUNTER — Other Ambulatory Visit: Payer: Self-pay | Admitting: Orthopaedic Surgery

## 2016-01-09 DIAGNOSIS — M25511 Pain in right shoulder: Secondary | ICD-10-CM

## 2016-01-13 ENCOUNTER — Other Ambulatory Visit: Payer: PRIVATE HEALTH INSURANCE

## 2016-01-13 ENCOUNTER — Ambulatory Visit
Admission: RE | Admit: 2016-01-13 | Discharge: 2016-01-13 | Disposition: A | Discharge status: Home / Self Care | Payer: PRIVATE HEALTH INSURANCE | Source: Ambulatory Visit | Attending: Orthopaedic Surgery | Admitting: Orthopaedic Surgery

## 2016-01-13 DIAGNOSIS — M25511 Pain in right shoulder: Secondary | ICD-10-CM

## 2016-02-20 ENCOUNTER — Other Ambulatory Visit: Payer: Self-pay

## 2016-02-20 DIAGNOSIS — I10 Essential (primary) hypertension: Secondary | ICD-10-CM

## 2016-02-20 MED ORDER — LOSARTAN POTASSIUM-HCTZ 100-25 MG PO TABS: 1.0000 | ORAL_TABLET | Freq: Every day | ORAL | Status: DC

## 2016-02-20 NOTE — Telephone Encounter (Signed)
Reviewed Cr and K+ from CareEverywhere, Rx approved

## 2016-03-11 ENCOUNTER — Ambulatory Visit: Payer: PRIVATE HEALTH INSURANCE | Admitting: Dietician

## 2016-03-25 ENCOUNTER — Encounter: Payer: Self-pay | Admitting: Dietician

## 2016-03-25 ENCOUNTER — Encounter: Payer: PRIVATE HEALTH INSURANCE | Attending: Internal Medicine | Admitting: Dietician

## 2016-03-25 VITALS — BP 118/76 | Ht 69.0 in | Wt 209.6 lb

## 2016-03-25 DIAGNOSIS — E1022 Type 1 diabetes mellitus with diabetic chronic kidney disease: Secondary | ICD-10-CM

## 2016-03-25 DIAGNOSIS — E108 Type 1 diabetes mellitus with unspecified complications: Secondary | ICD-10-CM | POA: Diagnosis present

## 2016-03-25 NOTE — Patient Instructions (Signed)
  Check blood sugars 4 x day before each meal and before bed every day Exercise:  Continue cardio and wt lifting  for  60  minutes   3-4  days a week Avoid sugar sweetened drinks (soda, tea, coffee, sports drinks, juices) Limit intake of fried foods and sweets Make healthy food choices Eat 3 carbohydrate servings/meal + protein Eat 1 carbohydrate serving/snack + protein Eat 3 meals day,   2 snacks a day in afternoon and at bedtime Space meals 4-6 hours apart Complete 3 Day Food Record and bring to next appt Make eye doctor appointment Bring blood sugar records to the next appointment/class Carry fast acting glucose and a snack at all times Carry medical alert ID at all times Rotate injection sites Call if questions or problems [phone] Return for appointment/classes on:  04-17-16

## 2016-03-25 NOTE — Progress Notes (Signed)
Diabetes Self-Management Education  Visit Type: First/Initial  Appt. Start Time: 1115 Appt. End Time:1215  03/25/2016  William Valdez, identified by name and date of birth, is a 35 y.o. male with a diagnosis of Diabetes: Type 1.   ASSESSMENT  Blood pressure 118/76, height  (1.753 m), weight 209 lb 9.6 oz (95.074 kg). Body mass index is 30.94 kg/(m^2). Lacks knowledge of diabetes care Does not carry medical alert ID or treatment for low BG's     Diabetes Self-Management Education - 03/25/16 1244    Visit Information   Visit Type First/Initial   Initial Visit   Diabetes Type Type 1   Health Coping   How would you rate your overall health? Fair   Psychosocial Assessment   Patient Belief/Attitude about Diabetes Motivated to manage diabetes   Self-care barriers None   Patient Concerns Glycemic Control;Healthy Lifestyle;Nutrition/Meal planning  prevent complications   Special Needs None   Preferred Learning Style Auditory   Learning Readiness Ready   What is the last grade level you completed in school? BA   Pre-Education Assessment   Patient understands the diabetes disease and treatment process. Needs Review   Patient understands incorporating nutritional management into lifestyle. Needs Review   Patient undertands incorporating physical activity into lifestyle. Needs Review   Patient understands using medications safely. Needs Review   Patient understands monitoring blood glucose, interpreting and using results Needs Review   Patient understands prevention, detection, and treatment of acute complications. Needs Review   Patient understands prevention, detection, and treatment of chronic complications. Needs Review   Patient understands how to develop strategies to address psychosocial issues. Needs Review   Patient understands how to develop strategies to promote health/change behavior. Needs Review   Complications   Last HgB A1C per patient/outside source 10.8 %   02-08-16   How often do you check your blood sugar? 3-4 times/day   Fasting Blood glucose range (mg/dL) >161  mostly 096'E-454'U   Number of hypoglycemic episodes per month reports 2 episodes recently of low BG since using updated sliding scale (did not check BG to confirm)   Number of hyperglycemic episodes per week --  most of time   Have you had a dilated eye exam in the past 12 months? Yes  1 year ago   Have you had a dental exam in the past 12 months? Yes  3 months ago   Are you checking your feet? No   Dietary Intake   Breakfast --  eats at [employer] --  eats at 11:30-12p; eats fried foods and sweets 2-3x/day and snack foods 4-5x/wk.   Dinner --  eats at [employer] (evening) --  eats occasional bedtime snack at 10p-chips   Beverage(s) --  drinks water 2-3x/day; diet sodas 6-7x/day; milk 2x/day   Exercise   Exercise Type --  cardio/wt lifting 1.5 hr 3x/wk   How many days per week to you exercise? 3   How many minutes per day do you exercise? 90   Total minutes per week of exercise 270   Patient Education   Previous Diabetes Education No   Disease state  --  discussed pathophysiology of type 1 diabetes and treatment   Nutrition management  Role of diet in the treatment of diabetes and the relationship between the three main macronutrients and blood glucose level;Food label reading, portion sizes and measuring food.;Carbohydrate counting   Physical activity and exercise  Role of exercise on diabetes management,  blood pressure control and cardiac health.  advised to only exercise when BG's are <250; discussed risk of ketone production     Medications Taught/reviewed insulin injection, site rotation, insulin storage and needle disposal.;Reviewed patients medication for diabetes, action, purpose, timing of dose and side effects.   Monitoring Purpose and frequency of SMBG.;Taught/discussed recording of test results and interpretation of SMBG.;Identified appropriate SMBG  and/or A1C goals.;Yearly dilated eye exam   Acute complications Taught treatment of hypoglycemia - the 15 rule.;Discussed and identified patients' treatment of hyperglycemia.  gave pt 1 tube of glucose tablets to carry for PRN use along with medical alert ID card-discussed importance of carrying these at all times   Chronic complications Relationship between chronic complications and blood glucose control;Dental care;Retinopathy and reason for yearly dilated eye exams;Identified and discussed with patient  current chronic complications   Psychosocial adjustment Role of stress on diabetes   Personal strategies to promote health Lifestyle issues that need to be addressed for better diabetes care      Individualized Plan for Diabetes Self-Management Training:   Learning Objective:  Patient will have a greater understanding of diabetes self-management. Patient education plan is to attend individual and/or group sessions per assessed needs and concerns.   Plan:   Patient Instructions   Check blood sugars 4 x day before each meal and before bed every day Exercise:  Continue cardio and wt lifting  for  60  minutes   3-4  days a week-ONLY if BG's <250 Avoid sugar sweetened drinks (soda, tea, coffee, sports drinks, juices) Limit intake of fried foods and sweets Make healthy food choices Eat 3 meals day,   2 snacks a day in afternoon and at bedtime Space meals 5 hours apart Eat 3 carbohydrate servings/meal + protein Eat 1 carbohydrate serving/snack + protein Complete 3 Day Food Record and bring to next appt Make eye doctor appointment Bring blood sugar records to the next appointment/class Carry fast acting glucose and a snack at all times Carry medical alert ID at all times Rotate injection sites Call if questions or problems arise-[phone] Return for appointment/classes on:  04-17-16   Expected Outcomes:   positive  Education material provided: General Meal Planning Guidelines, 1  tube glucose tablets for PRN use, medical alert ID card, low BG handout, Type 1 Diabetes booklet  If problems or questions, patient to contact team via:  [phone]  Future DSME appointment:

## 2016-04-02 ENCOUNTER — Telehealth: Payer: Self-pay | Admitting: Dietician

## 2016-04-02 NOTE — Telephone Encounter (Signed)
Called pt for update on BG's-no answer-left message for pt to call me

## 2016-04-17 ENCOUNTER — Ambulatory Visit: Payer: PRIVATE HEALTH INSURANCE | Admitting: Dietician

## 2016-04-24 ENCOUNTER — Ambulatory Visit: Payer: 59 | Admitting: Family Medicine

## 2016-05-13 ENCOUNTER — Encounter: Payer: Self-pay | Admitting: Dietician

## 2016-05-13 NOTE — Progress Notes (Signed)
Have not heard back from patient to reschedule his missed appointment from 04/19/16. Sent discharge letter to MD.

## 2016-05-21 ENCOUNTER — Other Ambulatory Visit: Payer: Self-pay

## 2016-05-21 NOTE — Telephone Encounter (Signed)
Patient sees Dr. Aliene AltesAbisogun, endo, for diabetes

## 2016-05-27 ENCOUNTER — Other Ambulatory Visit: Payer: Self-pay

## 2016-05-27 DIAGNOSIS — I10 Essential (primary) hypertension: Secondary | ICD-10-CM

## 2016-05-27 MED ORDER — LOSARTAN POTASSIUM-HCTZ 100-25 MG PO TABS: 1.0000 | ORAL_TABLET | Freq: Every day | ORAL | 0 refills | Status: DC

## 2016-05-27 NOTE — Telephone Encounter (Signed)
Creatinine and K+ from May 2017 in CareEverywhere reviewed; Rx approved appt needed for further refills beyond this one

## 2016-08-31 ENCOUNTER — Other Ambulatory Visit: Payer: Self-pay | Admitting: Family Medicine

## 2016-08-31 DIAGNOSIS — I10 Essential (primary) hypertension: Secondary | ICD-10-CM

## 2016-08-31 NOTE — Telephone Encounter (Signed)
Last Rx noted appt needed for further refills Patient canceled July appt I'll approve 7 but respectfully request he make and keep an appt to be seen

## 2016-09-02 ENCOUNTER — Other Ambulatory Visit: Payer: Self-pay

## 2016-09-02 NOTE — Telephone Encounter (Signed)
Please ask patient to get his diabetes medicines and supplies from his endocrinologist; thank you

## 2016-09-03 NOTE — Telephone Encounter (Signed)
Dr. lada the patient stated the Pharmacy sent the request to us first and to his endocrinologist. He mention to the pharmacist  that it should only go to his endocrinologist.

## 2016-11-25 ENCOUNTER — Other Ambulatory Visit: Payer: Self-pay | Admitting: Family Medicine

## 2016-11-25 DIAGNOSIS — I10 Essential (primary) hypertension: Secondary | ICD-10-CM

## 2016-11-25 NOTE — Telephone Encounter (Signed)
See notes from August and November 2017 Patient has not been seen in over a year Appointment and labs needed please Rx denied; he is encouraged to contact Dr. Aliene AltesAbisogun for this medicine if he won't or can't follow-up here

## 2016-11-26 NOTE — Telephone Encounter (Signed)
LMOM to inform pt to call and schedule an appt °

## 2017-08-08 DIAGNOSIS — E1065 Type 1 diabetes mellitus with hyperglycemia: Secondary | ICD-10-CM | POA: Insufficient documentation

## 2017-11-11 NOTE — Telephone Encounter (Signed)
Closing note 

## 2017-11-12 ENCOUNTER — Encounter: Payer: Self-pay | Admitting: Family Medicine

## 2017-11-12 ENCOUNTER — Ambulatory Visit: Payer: 59 | Admitting: Family Medicine

## 2017-11-12 VITALS — BP 150/110 | HR 79 | Temp 98.2°F | Ht 69.03 in | Wt 213.8 lb

## 2017-11-12 DIAGNOSIS — G8929 Other chronic pain: Secondary | ICD-10-CM | POA: Diagnosis not present

## 2017-11-12 DIAGNOSIS — D709 Neutropenia, unspecified: Secondary | ICD-10-CM

## 2017-11-12 DIAGNOSIS — K5900 Constipation, unspecified: Secondary | ICD-10-CM

## 2017-11-12 DIAGNOSIS — M25512 Pain in left shoulder: Secondary | ICD-10-CM | POA: Diagnosis not present

## 2017-11-12 DIAGNOSIS — E1022 Type 1 diabetes mellitus with diabetic chronic kidney disease: Secondary | ICD-10-CM | POA: Diagnosis not present

## 2017-11-12 DIAGNOSIS — N181 Chronic kidney disease, stage 1: Secondary | ICD-10-CM | POA: Diagnosis not present

## 2017-11-12 DIAGNOSIS — IMO0002 Reserved for concepts with insufficient information to code with codable children: Secondary | ICD-10-CM

## 2017-11-12 DIAGNOSIS — E1029 Type 1 diabetes mellitus with other diabetic kidney complication: Secondary | ICD-10-CM | POA: Diagnosis not present

## 2017-11-12 DIAGNOSIS — E1065 Type 1 diabetes mellitus with hyperglycemia: Secondary | ICD-10-CM

## 2017-11-12 DIAGNOSIS — E559 Vitamin D deficiency, unspecified: Secondary | ICD-10-CM | POA: Diagnosis not present

## 2017-11-12 DIAGNOSIS — Z23 Encounter for immunization: Secondary | ICD-10-CM

## 2017-11-12 DIAGNOSIS — N182 Chronic kidney disease, stage 2 (mild): Secondary | ICD-10-CM | POA: Diagnosis not present

## 2017-11-12 MED ORDER — LOSARTAN POTASSIUM-HCTZ 100-12.5 MG PO TABS: 1.0000 | ORAL_TABLET | Freq: Every day | ORAL | 0 refills | Status: DC

## 2017-11-12 MED ORDER — GLUCAGON (RDNA) 1 MG IJ KIT: 1.0000 mg | PACK | Freq: Once | INTRAMUSCULAR | 12 refills | Status: AC | PRN

## 2017-11-12 NOTE — Assessment & Plan Note (Signed)
Check vit D 

## 2017-11-12 NOTE — Patient Instructions (Addendum)
Someone should contact you about the diabetic education referral If you have not heard anything from my staff in a week about any orders/referrals/studies from today, please contact us here to follow-up [phone] Call if we can help with referrals for your shoulder Call Dr. Gershon Crane'Connell today about your low sugars today to see about adjusting your insulin regimen; if you are excising more, you will likely need less insulin If you need something for aches or pains, try to use Tylenol (acetaminophen) instead of non-steroidals (which include Aleve, ibuprofen, Advil, Motrin, and naproxen); non-steroidals can cause long-term kidney damage Try the Miralax daily to help with bowels Call me if not back to regular bowel movements in 1 week Start the new blood pressure combination DASH Eating Plan DASH stands for "Dietary Approaches to Stop Hypertension." The DASH eating plan is a healthy eating plan that has been shown to reduce high blood pressure (hypertension). It may also reduce your risk for type 2 diabetes, heart disease, and stroke. The DASH eating plan may also help with weight loss. What are tips for following this plan? General guidelines  Avoid eating more than 2,300 mg (milligrams) of salt (sodium) a day. If you have hypertension, you may need to reduce your sodium intake to 1,500 mg a day.  Limit alcohol intake to no more than 1 drink a day for nonpregnant women and 2 drinks a day for men. One drink equals 12 oz of beer, 5 oz of wine, or 1 oz of hard liquor.  Work with your health care provider to maintain a healthy body weight or to lose weight. Ask what an ideal weight is for you.  Get at least 30 minutes of exercise that causes your heart to beat faster (aerobic exercise) most days of the week. Activities may include walking, swimming, or biking.  Work with your health care provider or diet and nutrition specialist (dietitian) to adjust your eating plan to your individual calorie  needs. Reading food labels  Check food labels for the amount of sodium per serving. Choose foods with less than 5 percent of the Daily Value of sodium. Generally, foods with less than 300 mg of sodium per serving fit into this eating plan.  To find whole grains, look for the word "whole" as the first word in the ingredient list. Shopping  Buy products labeled as "low-sodium" or "no salt added."  Buy fresh foods. Avoid canned foods and premade or frozen meals. Cooking  Avoid adding salt when cooking. Use salt-free seasonings or herbs instead of table salt or sea salt. Check with your health care provider or pharmacist before using salt substitutes.  Do not fry foods. Cook foods using healthy methods such as baking, boiling, grilling, and broiling instead.  Cook with heart-healthy oils, such as olive, canola, soybean, or sunflower oil. Meal planning   Eat a balanced diet that includes: ? 5 or more servings of fruits and vegetables each day. At each meal, try to fill half of your plate with fruits and vegetables. ? Up to 6-8 servings of whole grains each day. ? Less than 6 oz of lean meat, poultry, or fish each day. A 3-oz serving of meat is about the same size as a deck of cards. One egg equals 1 oz. ? 2 servings of low-fat dairy each day. ? A serving of nuts, seeds, or beans 5 times each week. ? Heart-healthy fats. Healthy fats called Omega-3 fatty acids are found in foods such as flaxseeds and coldwater fish,  like sardines, salmon, and mackerel.  Limit how much you eat of the following: ? Canned or prepackaged foods. ? Food that is high in trans fat, such as fried foods. ? Food that is high in saturated fat, such as fatty meat. ? Sweets, desserts, sugary drinks, and other foods with added sugar. ? Full-fat dairy products.  Do not salt foods before eating.  Try to eat at least 2 vegetarian meals each week.  Eat more home-cooked food and less restaurant, buffet, and fast  food.  When eating at a restaurant, ask that your food be prepared with less salt or no salt, if possible. What foods are recommended? The items listed may not be a complete list. Talk with your dietitian about what dietary choices are best for you. Grains Whole-grain or whole-wheat bread. Whole-grain or whole-wheat pasta. Brown rice. William Valdez. Bulgur. Whole-grain and low-sodium cereals. Pita bread. Low-fat, low-sodium crackers. Whole-wheat flour tortillas. Vegetables Fresh or frozen vegetables (raw, steamed, roasted, or grilled). Low-sodium or reduced-sodium tomato and vegetable juice. Low-sodium or reduced-sodium tomato sauce and tomato paste. Low-sodium or reduced-sodium canned vegetables. Fruits All fresh, dried, or frozen fruit. Canned fruit in natural juice (without added sugar). Meat and other protein foods Skinless chicken or William Valdez. Ground chicken or William Valdez. Pork with fat trimmed off. Fish and seafood. Egg whites. Dried beans, peas, or lentils. Unsalted nuts, nut butters, and seeds. Unsalted canned beans. Lean cuts of beef with fat trimmed off. Low-sodium, lean deli meat. Dairy Low-fat (1%) or fat-free (skim) milk. Fat-free, low-fat, or reduced-fat cheeses. Nonfat, low-sodium ricotta or cottage cheese. Low-fat or nonfat yogurt. Low-fat, low-sodium cheese. Fats and oils Soft margarine without trans fats. Vegetable oil. Low-fat, reduced-fat, or light mayonnaise and salad dressings (reduced-sodium). Canola, safflower, olive, soybean, and sunflower oils. Avocado. Seasoning and other foods Herbs. Spices. Seasoning mixes without salt. Unsalted popcorn and pretzels. Fat-free sweets. What foods are not recommended? The items listed may not be a complete list. Talk with your dietitian about what dietary choices are best for you. Grains Baked goods made with fat, such as croissants, muffins, or some breads. Dry pasta or rice meal packs. Vegetables Creamed or fried vegetables. Vegetables  in a cheese sauce. Regular canned vegetables (not low-sodium or reduced-sodium). Regular canned tomato sauce and paste (not low-sodium or reduced-sodium). Regular tomato and vegetable juice (not low-sodium or reduced-sodium). William Valdez. Olives. Fruits Canned fruit in a light or heavy syrup. Fried fruit. Fruit in cream or butter sauce. Meat and other protein foods Fatty cuts of meat. Ribs. Fried meat. William Valdez. Sausage. Bologna and other processed lunch meats. Salami. Fatback. Hotdogs. Bratwurst. Salted nuts and seeds. Canned beans with added salt. Canned or smoked fish. Whole eggs or egg yolks. Chicken or William Valdez with skin. Dairy Whole or 2% milk, cream, and half-and-half. Whole or full-fat cream cheese. Whole-fat or sweetened yogurt. Full-fat cheese. Nondairy creamers. Whipped toppings. Processed cheese and cheese spreads. Fats and oils Butter. Stick margarine. Lard. Shortening. Ghee. Bacon fat. Tropical oils, such as coconut, palm kernel, or palm oil. Seasoning and other foods Salted popcorn and pretzels. Onion salt, garlic salt, seasoned salt, table salt, and sea salt. Worcestershire sauce. Tartar sauce. Barbecue sauce. Teriyaki sauce. Soy sauce, including reduced-sodium. Steak sauce. Canned and packaged gravies. Fish sauce. Oyster sauce. Cocktail sauce. Horseradish that you find on the shelf. Ketchup. Mustard. Meat flavorings and tenderizers. Bouillon cubes. Hot sauce and Tabasco sauce. Premade or packaged marinades. Premade or packaged taco seasonings. Relishes. Regular salad dressings. Where to find more information:  National Heart, Lung, and Blood Institute: PopSteam.is  American Heart Association: www.heart.org Summary  The DASH eating plan is a healthy eating plan that has been shown to reduce high blood pressure (hypertension). It may also reduce your risk for type 2 diabetes, heart disease, and stroke.  With the DASH eating plan, you should limit salt (sodium) intake to 2,300 mg a  day. If you have hypertension, you may need to reduce your sodium intake to 1,500 mg a day.  When on the DASH eating plan, aim to eat more fresh fruits and vegetables, whole grains, lean proteins, low-fat dairy, and heart-healthy fats.  Work with your health care provider or diet and nutrition specialist (dietitian) to adjust your eating plan to your individual calorie needs. This information is not intended to replace advice given to you by your health care provider. Make sure you discuss any questions you have with your health care provider. Document Released: 09/12/2011 Document Revised: 09/16/2016 Document Reviewed: 09/16/2016 Elsevier Interactive Patient Education  2018 [employer].  Constipation, Adult Constipation is when a person:  Poops (has a bowel movement) fewer times in a week than normal.  Has a hard time pooping.  Has poop that is dry, hard, or bigger than normal.  Follow these instructions at home: Eating and drinking   Eat foods that have a lot of fiber, such as: ? Fresh fruits and vegetables. ? Whole grains. ? Beans.  Eat less of foods that are high in fat, low in fiber, or overly processed, such as: ? [place] fries. ? Hamburgers. ? Cookies. ? Candy. ? Soda.  Drink enough fluid to keep your pee (urine) clear or pale yellow. General instructions  Exercise regularly or as told by your doctor.  Go to the restroom when you feel like you need to poop. Do not hold it in.  Take over-the-counter and prescription medicines only as told by your doctor. These include any fiber supplements.  Do pelvic floor retraining exercises, such as: ? Doing deep breathing while relaxing your lower belly (abdomen). ? Relaxing your pelvic floor while pooping.  Watch your condition for any changes.  Keep all follow-up visits as told by your doctor. This is important. Contact a doctor if:  You have pain that gets worse.  You have a fever.  You have not pooped for 4  days.  You throw up (vomit).  You are not hungry.  You lose weight.  You are bleeding from the anus.  You have thin, pencil-like poop (stool). Get help right away if:  You have a fever, and your symptoms suddenly get worse.  You leak poop or have blood in your poop.  Your belly feels hard or bigger than normal (is bloated).  You have very bad belly pain.  You feel dizzy or you faint. This information is not intended to replace advice given to you by your health care provider. Make sure you discuss any questions you have with your health care provider. Document Released: 03/11/2008 Document Revised: 04/12/2016 Document Reviewed: 03/13/2016 Elsevier Interactive Patient Education  2018 [employer].

## 2017-11-12 NOTE — Assessment & Plan Note (Signed)
Open invitation for referral to either PT/OT or orthopaedics; he is welcome to call

## 2017-11-12 NOTE — Assessment & Plan Note (Addendum)
Refer to diabetic educator; managed by endo; he'll talk with endo about pump, and I encouraged him to call today given the lows he is having; glucagon Rx provided, encouraged him to explain to family, co-worker about pen to use if emergency, hypoglycemic episode; foot exam by MD today; eye exam July or so so UTD; last urine microalbumin:Cr reviewed at endo office March 2018

## 2017-11-12 NOTE — Progress Notes (Signed)
BP (!) 150/110 (BP Location: Left Arm, Patient Position: Sitting, Cuff Size: Large)   Pulse 79   Temp 98.2 F (36.8 C) (Oral)   Ht 5' 9.03" (1.753 m)   Wt 213 lb 12.8 oz (97 kg)   SpO2 98%   BMI 31.55 kg/m    Subjective:    Patient ID: William Valdez, male    DOB: 04/27/1981, 37 y.o.   MRN: [id number]  HPI: William Valdez is a 37 y.o. male  Chief Complaint  Patient presents with  . Establish Care    Pt states he is here to restablish was a patient in the past   . Hypertension  . Constipation    Pt states that he is only having one BM a week, has to take a laxative to go on the weekends     HPI Patient is here to re-establish; he has been here within the last 3 years, so is technically not new to the practice, but he is new to me; his previous doctor moved out of the area  High blood pressure; he has been off of losartan for a while; he used to be on losartan/hctz and doesn't know why the hctz was dropped; he does not recall any problems with low K+; reviewed last several electrolyte panels and no hypokalemia noted; he does not add salt to his food  He has left arm problems; had right arm problems and went to PT; doing the exercises on his left arm now that he used to do on his right; [facility] did his right arm surgery  Type 1 diabetes; A1c is coming down he says He went to endo, BP was 166/110 there and high here Check FSBS 3-4 x a day; lowest in last 2 weeks, lots of lows he says; with his current regimen He works out early 4:30 am  Constipation; only having BM once a week; going on for a few weeks; take something on the weekends; good water drinker; asked about thyroid sx; stuck at the same weight; lost at one point, but gained back, can't lose the weight; no known fam hx  Vit D deficiency; level was 17 when last checked  Low WBC discovered incidentally in 2016; he denies frequent infections  He has never had a pneumonia vaccine; he has not had his flu shot  this year  Depression screen [facility] 2/9 11/12/2017 03/25/2016 10/25/2015 08/23/2015 06/23/2015  Decreased Interest 0 0 0 0 0  Down, Depressed, Hopeless 0 0 0 0 0  PHQ - 2 Score 0 0 0 0 0    Relevant past medical, surgical, family and social history reviewed Past Medical History:  Diagnosis Date  . Chronic kidney disease   . Diabetes mellitus without complication (HCC)   . Hypertension goal BP (blood pressure) < 140/90 03/24/2015   Past Surgical History:  Procedure Laterality Date  . SHOULDER SURGERY Right    Family History  Problem Relation Age of Onset  . Heart disease Mother   . Cancer Mother        breast  . Heart disease Father    Social History   Tobacco Use  . Smoking status: Never Smoker  . Smokeless tobacco: Never Used  Substance Use Topics  . Alcohol use: No    Alcohol/week: 0.0 oz  . Drug use: No   Interim medical history since last visit reviewed. Allergies and medications reviewed  Review of Systems Per HPI unless specifically indicated above  Objective:    BP (!) 150/110 (BP Location: Left Arm, Patient Position: Sitting, Cuff Size: Large)   Pulse 79   Temp 98.2 F (36.8 C) (Oral)   Ht 5' 9.03" (1.753 m)   Wt 213 lb 12.8 oz (97 kg)   SpO2 98%   BMI 31.55 kg/m   Wt Readings from Last 3 Encounters:  11/12/17 213 lb 12.8 oz (97 kg)  03/25/16 209 lb 9.6 oz (95.1 kg)  10/25/15 195 lb 12.8 oz (88.8 kg)    Physical Exam  Constitutional: He appears well-developed and well-nourished. No distress.  obese  HENT:  Head: Normocephalic and atraumatic.  Eyes: EOM are normal. No scleral icterus.  Neck: No thyromegaly present.  Cardiovascular: Normal rate and regular rhythm.  Pulmonary/Chest: Effort normal and breath sounds normal. He has no wheezes.  Abdominal: Soft. Bowel sounds are normal. He exhibits no distension. There is no tenderness. There is no guarding.  Musculoskeletal: He exhibits no edema.       Left shoulder: He exhibits decreased range of  motion.       Arms: Neurological: Coordination normal.  Skin: Skin is warm and dry. No pallor.  Psychiatric: He has a normal mood and affect.   Diabetic Foot Form - Detailed   Diabetic Foot Exam - detailed Diabetic Foot exam was performed with the following findings:  Yes 11/12/2017 10:18 AM  Visual Foot Exam completed.:  Yes  Pulse Foot Exam completed.:  Yes  Right Dorsalis Pedis:  Present Left Dorsalis Pedis:  Present  Sensory Foot Exam Completed.:  Yes Semmes-Weinstein Monofilament Test R Site 1-Great Toe:  Pos L Site 1-Great Toe:  Pos       Results for orders placed or performed in visit on 10/25/15  POCT HgB A1C  Result Value Ref Range   Hemoglobin A1C 14.0       Assessment & Plan:   Problem List Items Addressed This Visit      Endocrine   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) - Primary   Relevant Medications   Insulin Glargine (TOUJEO SOLOSTAR) 300 UNIT/ML SOPN   insulin lispro (HUMALOG) 100 UNIT/ML KiwkPen   glucagon (GLUCAGON EMERGENCY) 1 MG injection   losartan-hydrochlorothiazide (HYZAAR) 100-12.5 MG tablet   Other Relevant Orders   Hemoglobin A1c   Ambulatory referral to diabetic education   COMPLETE METABOLIC PANEL WITH GFR   Lipid panel   DM (diabetes mellitus), type 1, uncontrolled, with renal complications (HCC)    Refer to diabetic educator; managed by endo; he'll talk with endo about pump, and I encouraged him to call today given the lows he is having; glucagon Rx provided, encouraged him to explain to family, co-worker about pen to use if emergency, hypoglycemic episode; foot exam by MD today; eye exam July or so so UTD; last urine microalbumin:Cr reviewed at endo office March 2018      Relevant Medications   Insulin Glargine (TOUJEO SOLOSTAR) 300 UNIT/ML SOPN   insulin lispro (HUMALOG) 100 UNIT/ML KiwkPen   glucagon (GLUCAGON EMERGENCY) 1 MG injection   losartan-hydrochlorothiazide (HYZAAR) 100-12.5 MG tablet   Other  Relevant Orders   Ambulatory referral to diabetic education   COMPLETE METABOLIC PANEL WITH GFR   Lipid panel     Genitourinary   CKD (chronic kidney disease), stage II (Chronic)    Increase losartan; avoid NSAIDs        Other   Vitamin D deficiency (Chronic)    Check vit D  Relevant Orders   VITAMIN D 25 Hydroxy (Vit-D Deficiency, Fractures)   Neutropenia (HCC) (Chronic)    Check WBC      Relevant Orders   CBC with Differential/Platelet   Constipation    Increase fiber some, continue hydration; try Miralax; check TSH      Relevant Orders   TSH   Chronic left shoulder pain    Open invitation for referral to either PT/OT or orthopaedics; he is welcome to call       Other Visit Diagnoses    Flu vaccine need       recommended and given today   Relevant Orders   Flu Vaccine QUAD 36+ mos IM (Completed)   Need for 23-polyvalent pneumococcal polysaccharide vaccine       recommended and given today       Follow up plan: Return in about 2 weeks (around 11/26/2017) for blood pressure recheck with CMA and BMP.  An after-visit summary was printed and given to the patient at check-out.  Please see the patient instructions which may contain other information and recommendations beyond what is mentioned above in the assessment and plan.  Meds ordered this encounter  Medications  . glucagon (GLUCAGON EMERGENCY) 1 MG injection    Sig: Inject 1 mg into the vein once as needed for up to 1 dose.    Dispense:  1 each    Refill:  12  . losartan-hydrochlorothiazide (HYZAAR) 100-12.5 MG tablet    Sig: Take 1 tablet by mouth daily.    Dispense:  30 tablet    Refill:  0    Orders Placed This Encounter  Procedures  . Pneumococcal polysaccharide vaccine 23-valent greater than or equal to 2yo subcutaneous/IM  . Flu Vaccine QUAD 36+ mos IM  . Hemoglobin A1c  . COMPLETE METABOLIC PANEL WITH GFR  . TSH  . CBC with Differential/Platelet  . VITAMIN D 25 Hydroxy (Vit-D  Deficiency, Fractures)  . Lipid panel  . Ambulatory referral to diabetic education

## 2017-11-12 NOTE — Assessment & Plan Note (Signed)
Increase fiber some, continue hydration; try Miralax; check TSH

## 2017-11-12 NOTE — Assessment & Plan Note (Signed)
Check WBC 

## 2017-11-12 NOTE — Assessment & Plan Note (Signed)
Increase losartan; avoid NSAIDs

## 2017-11-13 LAB — CBC WITH DIFFERENTIAL/PLATELET
BASOS ABS: 21 {cells}/uL (ref 0–200)
Basophils Relative: 0.7 %
EOS ABS: 21 {cells}/uL (ref 15–500)
EOS PCT: 0.7 %
HCT: 42.7 % (ref 38.5–50.0)
Hemoglobin: 14.9 g/dL (ref 13.2–17.1)
Lymphs Abs: 1293 cells/uL (ref 850–3900)
MCH: 29.7 pg (ref 27.0–33.0)
MCHC: 34.9 g/dL (ref 32.0–36.0)
MCV: 85.1 fL (ref 80.0–100.0)
MONOS PCT: 8.2 %
MPV: 12 fL (ref 7.5–12.5)
NEUTROS PCT: 47.3 %
Neutro Abs: 1419 cells/uL — ABNORMAL LOW (ref 1500–7800)
PLATELETS: 209 10*3/uL (ref 140–400)
RBC: 5.02 10*6/uL (ref 4.20–5.80)
RDW: 12.1 % (ref 11.0–15.0)
TOTAL LYMPHOCYTE: 43.1 %
WBC mixed population: 246 cells/uL (ref 200–950)
WBC: 3 10*3/uL — ABNORMAL LOW (ref 3.8–10.8)

## 2017-11-13 LAB — TSH: TSH: 0.86 m[IU]/L (ref 0.40–4.50)

## 2017-11-13 LAB — COMPLETE METABOLIC PANEL WITH GFR
AG RATIO: 1.5 (calc) (ref 1.0–2.5)
ALBUMIN MSPROF: 4.3 g/dL (ref 3.6–5.1)
ALKALINE PHOSPHATASE (APISO): 91 U/L (ref 40–115)
ALT: 27 U/L (ref 9–46)
AST: 32 U/L (ref 10–40)
BUN: 14 mg/dL (ref 7–25)
CO2: 29 mmol/L (ref 20–32)
Calcium: 9.3 mg/dL (ref 8.6–10.3)
Chloride: 104 mmol/L (ref 98–110)
Creat: 1.23 mg/dL (ref 0.60–1.35)
GFR, EST AFRICAN AMERICAN: 88 mL/min/{1.73_m2} (ref >=60)
GFR, Est Non African American: 76 mL/min/{1.73_m2} (ref >=60)
GLUCOSE: 66 mg/dL (ref 65–139)
Globulin: 2.8 g/dL (calc) (ref 1.9–3.7)
POTASSIUM: 4.1 mmol/L (ref 3.5–5.3)
Sodium: 139 mmol/L (ref 135–146)
TOTAL PROTEIN: 7.1 g/dL (ref 6.1–8.1)
Total Bilirubin: 0.4 mg/dL (ref 0.2–1.2)

## 2017-11-13 LAB — LIPID PANEL
CHOLESTEROL: 111 mg/dL (ref <200)
HDL: 60 mg/dL (ref >40)
LDL Cholesterol (Calc): 38 mg/dL (calc)
Non-HDL Cholesterol (Calc): 51 mg/dL (calc) (ref <130)
Total CHOL/HDL Ratio: 1.9 (calc) (ref <5.0)
Triglycerides: 46 mg/dL (ref <150)

## 2017-11-13 LAB — VITAMIN D 25 HYDROXY (VIT D DEFICIENCY, FRACTURES): VIT D 25 HYDROXY: 12 ng/mL — AB (ref 30–100)

## 2017-11-13 LAB — HEMOGLOBIN A1C
HEMOGLOBIN A1C: 9.7 %{Hb} — AB (ref <5.7)
Mean Plasma Glucose: 232 (calc)
eAG (mmol/L): 12.8 (calc)

## 2017-11-14 ENCOUNTER — Other Ambulatory Visit: Payer: Self-pay | Admitting: Family Medicine

## 2017-11-14 ENCOUNTER — Telehealth: Payer: Self-pay

## 2017-11-14 DIAGNOSIS — D709 Neutropenia, unspecified: Secondary | ICD-10-CM

## 2017-11-14 MED ORDER — VITAMIN D (ERGOCALCIFEROL) 1.25 MG (50000 UNIT) PO CAPS: 50000.0000 [IU] | ORAL_CAPSULE | ORAL | 1 refills | Status: AC

## 2017-11-14 NOTE — Telephone Encounter (Signed)
-----   Message from Kerman PasseyMelinda P Lada, MD sent at 11/14/2017 11:57 AM EST ----- Idamae Schullerkie, please let the patient know that his white blood cell count is still low; he is not making enough of the cells that fight bacterial infections; this is slightly better than the last time it was checked 2 years ago; I'd like to just follow this, as I suspect it's not dangerous; let's add on a peripheral smear (path review) to blood in lab, and then check recheck his CBC with diff in 3 months (I've ordered these, just release the path review today); just use good handwashing avoid sick people to help prevent illness His vitamin D is very low; I'll send in some prescription vitamin D that he can take once a week for 8 weeks, then just take 1,000 iu OTC vitamin D3 daily His cholesterol is fabulous! His A1c has improved, though still not to goal, so keep working with the diabetes specialist on this; please send results to endo; thank you

## 2017-11-14 NOTE — Assessment & Plan Note (Signed)
Check path smear; recheck in 3 months; likely benign ethnic neutropenia

## 2017-11-14 NOTE — Progress Notes (Signed)
Start Rx vitamin D Add on path review to blood in lab Recheck CBC in 3 months

## 2017-11-14 NOTE — Telephone Encounter (Signed)
Called pt informed him of the information below. Pt gave verbal understanding. Also sent text for mychart sign up.

## 2017-11-26 ENCOUNTER — Ambulatory Visit: Payer: 59

## 2017-12-01 ENCOUNTER — Encounter: Payer: Self-pay | Admitting: Dietician

## 2017-12-01 ENCOUNTER — Encounter: Payer: PRIVATE HEALTH INSURANCE | Attending: Family Medicine | Admitting: Dietician

## 2017-12-01 VITALS — BP 150/108 | Ht 69.0 in | Wt 209.8 lb

## 2017-12-01 DIAGNOSIS — Z713 Dietary counseling and surveillance: Secondary | ICD-10-CM | POA: Insufficient documentation

## 2017-12-01 DIAGNOSIS — E1065 Type 1 diabetes mellitus with hyperglycemia: Secondary | ICD-10-CM | POA: Insufficient documentation

## 2017-12-01 DIAGNOSIS — E1022 Type 1 diabetes mellitus with diabetic chronic kidney disease: Secondary | ICD-10-CM | POA: Diagnosis not present

## 2017-12-01 DIAGNOSIS — R809 Proteinuria, unspecified: Secondary | ICD-10-CM

## 2017-12-01 DIAGNOSIS — N181 Chronic kidney disease, stage 1: Secondary | ICD-10-CM | POA: Insufficient documentation

## 2017-12-01 DIAGNOSIS — E1029 Type 1 diabetes mellitus with other diabetic kidney complication: Secondary | ICD-10-CM

## 2017-12-01 NOTE — Patient Instructions (Addendum)
  Check blood sugars 4 x day before each meal and before bed every day Bring blood sugar records to the next appointment/class Exercise:  Continue crossfit 45  min. 4x/wk Eat 3 meals day, and  2-3 snacks a day-in afternoon and AM/bedtime if needed Eat 3 carbohydrate servings/meal + protein Eat 1 carbohydrate serving/snack + protein  Avoid sweetened beverages,sports drinks, juices Limit intake of sweets/desserts and fried foods Make healthy food choices Complete 3 Day Food Record and bring to next appt Carry fast acting glucose and a snack at all times Take Humalog 15-20 min before meals when able Follow MD's Humalog dosing for meals-may take 8-20 units before meals Rotate injection sites Recheck BP within 1 week-follow up with MD if still elevated Call if questions or problems arise-[phone] or  [phone] Return for appointment/classes on: 12-15-17

## 2017-12-01 NOTE — Progress Notes (Signed)
Diabetes Self-Management Education  Visit Type: First/Initial  Appt. Start Time: 1530 Appt. End Time: 1700  12/01/2017  [name], identified by name and date of birth, is a 37 y.o. male with a diagnosis of Diabetes: Type 1.   ASSESSMENT  Blood pressure (!) 150/108, height 5\' 9"  (1.753 m), weight 209 lb 12.8 oz (95.2 kg). Body mass index is 30.98 kg/m.  Diabetes Self-Management Education - 12/01/17 1741      Visit Information   Visit Type  First/Initial      Initial Visit   Diabetes Type  Type 1      Health Coping   How would you rate your overall health?  Fair      Psychosocial Assessment   Patient Belief/Attitude about Diabetes  Motivated to manage diabetes    Self-care barriers  None    Self-management support  Doctor's office;Family    Other persons present  Patient    Patient Concerns  Glycemic Control;Weight Control;Medication;Healthy Lifestyle prevent complications    Special Needs  None    Preferred Learning Style  Auditory;Visual;Hands on    Learning Readiness  Ready    What is the last grade level you completed in school?  BA      Pre-Education Assessment   Patient understands the diabetes disease and treatment process.  Needs Review    Patient understands incorporating nutritional management into lifestyle.  Needs Review    Patient undertands incorporating physical activity into lifestyle.  Needs Review    Patient understands using medications safely.  Needs Review    Patient understands monitoring blood glucose, interpreting and using results  Needs Review    Patient understands prevention, detection, and treatment of acute complications.  Needs Review    Patient understands prevention, detection, and treatment of chronic complications.  Needs Review    Patient understands how to develop strategies to address psychosocial issues.  Needs Review    Patient understands how to develop strategies to promote health/change behavior.  Needs Review       Complications   Last HgB A1C per patient/outside source  9.7 % 11-12-17    How often do you check your blood sugar?  1-2 times/day    Fasting Blood glucose range (mg/dL)  16-109;>604;540-981;191-478 ac supper 110's- 200's; occasional 400's if he misses Humalog dose for meal    Postprandial Blood glucose range (mg/dL)  <29 c/o some recent low BG's during night and after supper    Have you had a dilated eye exam in the past 12 months?  Yes 06-2017    Have you had a dental exam in the past 12 months?  Yes 4 months ago    Are you checking your feet?  Yes    How many days per week are you checking your feet?  7      Dietary Intake   Breakfast  eats breakfast at 6:30a (egg whites, toast 1/2 banana)    Snack (morning)  eats chips, crackers or trail mix  at 10a    Lunch  usually eats lunch at 11:30a-eats fast food ([employer]-eats grilled or fried chicken sandwich + occasional fries)    Snack (afternoon)  eats chips, crackers or Trail mix at [employer]  eats supper at 6:30p-usually eats meat and vegetables ; eats sweets 4-5x/wk    Snack (evening)  eats chips at 7:30p-eats chips, crackers or trail mix; eats snack at 9p when BG low-chips, crackers or trail mix    Beverage(s)  drinks water 4-5x/day and sugar free beverages 4-5x/day      Exercise   Exercise Type  Moderate (swimming / aerobic walking) crossfit    How many days per week to you exercise?  4    How many minutes per day do you exercise?  45    Total minutes per week of exercise  180      Patient Education   Previous Diabetes Education  No    Disease state   Definition of diabetes, type 1 and 2, and the diagnosis of diabetes    Nutrition management   Role of diet in the treatment of diabetes and the relationship between the three main macronutrients and blood glucose level;Carbohydrate counting;Food label reading, portion sizes and measuring food.    Physical activity and exercise   Role of exercise on diabetes management, blood pressure  control and cardiac health.    Medications  Taught/reviewed insulin injection, site rotation, insulin storage and needle disposal.;Reviewed patients medication for diabetes, action, purpose, timing of dose and side effects.    Monitoring  Purpose and frequency of SMBG.;Taught/discussed recording of test results and interpretation of SMBG.;Identified appropriate SMBG and/or A1C goals.;Yearly dilated eye exam    Acute complications  Trained/discussed glucagon administration to patient and designated other.;Taught treatment of hypoglycemia - the 15 rule.    Chronic complications  Relationship between chronic complications and blood glucose control;Dental care;Reviewed with patient heart disease, higher risk of, and prevention;Retinopathy and reason for yearly dilated eye exams;Nephropathy, what it is, prevention of, the use of ACE, ARB's and early detection of through urine microalbumia.;Identified and discussed with patient  current chronic complications;Lipid levels, blood glucose control and heart disease    Personal strategies to promote health  Lifestyle issues that need to be addressed for better diabetes care;Helped patient develop diabetes management plan for (enter comment)      Outcomes   Expected Outcomes  Demonstrated interest in learning. Expect positive outcomes       Individualized Plan for Diabetes Self-Management Training:   Learning Objective:  Patient will have a greater understanding of diabetes self-management. Patient education plan is to attend individual and/or group sessions per assessed needs and concerns.   Plan:   Patient Instructions   Check blood sugars 4 x day before each meal and before bed every day Bring blood sugar records to the next appointment/class Exercise:  Continue crossfit 45  min. 4x/wk Eat 3 meals day, and  2-3 snacks a day-in afternoon and AM/bedtime if needed Eat 3 carbohydrate servings/meal + protein Eat 1 carbohydrate serving/snack + protein   Avoid sweetened beverages,sports drinks, juices Limit intake of sweets/desserts and fried foods Make healthy food choices Complete 3 Day Food Record and bring to next appt Carry fast acting glucose and a snack at all times Take Humalog 15-20 min before meals when able Follow MD's Humalog dosing for meals- adjust dose 8-20 units before meals Rotate injection sites Recheck BP within 1 week-follow up with MD if still elevated Call if questions or problems [phone] or  [phone] Return for appointment/classes on: 12-15-17   Expected Outcomes:  Demonstrated interest in learning. Expect positive outcomes  Education material provided:General meal planning guidelines, low BG handout, medical alert ID card and coupon, 1 tube glucose tablets for PRN use  If problems or questions, patient to contact team via: [phone]  Future DSME appointment: 12-15-17

## 2017-12-11 ENCOUNTER — Other Ambulatory Visit: Payer: Self-pay | Admitting: Family Medicine

## 2017-12-11 NOTE — Telephone Encounter (Signed)
Last CMP reviewed; Rx approved [place], I never received the peripheral smear results (see request put in on 11/14/17, in the 11/12/17 CBC results note) If they did not run it, please have the patient come back in for the peripheral smear and we'll just do the CBC now with it

## 2017-12-11 NOTE — Telephone Encounter (Signed)
Called pt, no answer. LM for pt informing him of the need to come by and have labs done. CRM created.

## 2017-12-15 ENCOUNTER — Encounter: Payer: Self-pay | Admitting: Dietician

## 2017-12-15 ENCOUNTER — Encounter: Payer: PRIVATE HEALTH INSURANCE | Attending: Family Medicine | Admitting: Dietician

## 2017-12-15 VITALS — BP 140/98 | Ht 69.0 in | Wt 215.4 lb

## 2017-12-15 DIAGNOSIS — N181 Chronic kidney disease, stage 1: Secondary | ICD-10-CM | POA: Insufficient documentation

## 2017-12-15 DIAGNOSIS — E1022 Type 1 diabetes mellitus with diabetic chronic kidney disease: Secondary | ICD-10-CM | POA: Insufficient documentation

## 2017-12-15 DIAGNOSIS — Z713 Dietary counseling and surveillance: Secondary | ICD-10-CM | POA: Insufficient documentation

## 2017-12-15 DIAGNOSIS — E1065 Type 1 diabetes mellitus with hyperglycemia: Secondary | ICD-10-CM | POA: Diagnosis not present

## 2017-12-15 DIAGNOSIS — R809 Proteinuria, unspecified: Secondary | ICD-10-CM

## 2017-12-15 DIAGNOSIS — E1029 Type 1 diabetes mellitus with other diabetic kidney complication: Secondary | ICD-10-CM | POA: Insufficient documentation

## 2017-12-15 NOTE — Progress Notes (Signed)
Diabetes Self-Management Education  Visit Type:  Comprehensive  Appt. Start Time: 1600 Appt. End Time: 1700  12/15/2017  [name], identified by name and date of birth, is a 37 y.o. male with a diagnosis of Diabetes:  .   ASSESSMENT  Blood pressure (!) 140/98, height 5\' 9"  (1.753 m), weight 215 lb 6.4 oz (97.7 kg). Body mass index is 31.81 kg/m.   Diabetes Self-Management Education - 12/15/17 1607      Complications   How often do you check your blood sugar?  3-4 times/day    Fasting Blood glucose range (mg/dL)  16-109;604-540;981-191;>47870-129;130-179;180-200;>200    Postprandial Blood glucose range (mg/dL)  <29;56-213;086-578;469-629;>528<70;70-129;130-179;180-200;>200    Number of hypoglycemic episodes per month  1    Can you tell when your blood sugar is low?  Yes    What do you do if your blood sugar is low?  drank juice    Have you had a dilated eye exam in the past 12 months?  Yes    Have you had a dental exam in the past 12 months?  Yes    Are you checking your feet?  Yes    How many days per week are you checking your feet?  7      Dietary Intake   Breakfast  2-3 meals and 1-3 snacks daily    Beverage(s)  water, sugar free beverages      Exercise   Exercise Type  Moderate (swimming / aerobic walking)    How many days per week to you exercise?  4    How many minutes per day do you exercise?  45    Total minutes per week of exercise  180      Patient Education   Nutrition management   Role of diet in the treatment of diabetes and the relationship between the three main macronutrients and blood glucose level;Food label reading, portion sizes and measuring food.;Meal options for control of blood glucose level and chronic complications.;Other (comment) basic carb counting principles, plate method meal planning    Physical activity and exercise   Role of exercise on diabetes management, blood pressure control and cardiac health.;Identified with patient nutritional and/or medication changes necessary with exercise.    Monitoring  Taught/discussed recording of test results and interpretation of SMBG.    Acute complications  Taught treatment of hypoglycemia - the 15 rule.    Chronic complications  Relationship between chronic complications and blood glucose control       Learning Objective:  Patient will have a greater understanding of diabetes self-management. Patient education plan is to attend individual and/or group sessions per assessed needs and concerns.   Plan:   Patient Instructions   Control carb intake with meals to 45-60grams, stay consistent for now to help improve BG control.      Expected Outcomes:   Patient shows interest in learning and improving BG control.   Education material provided: Designer, industrial/productlate planner with food lists; Planning a Balanced Meal  If problems or questions, patient to contact team via:  Phone and Email  Future DSME appointment: -  01/13/18

## 2017-12-15 NOTE — Patient Instructions (Signed)
   Control carb intake with meals to 45-60grams, stay consistent for now to help improve BG control.

## 2018-01-13 ENCOUNTER — Encounter: Payer: Self-pay | Admitting: Dietician

## 2018-01-13 ENCOUNTER — Ambulatory Visit: Payer: PRIVATE HEALTH INSURANCE | Admitting: Dietician

## 2018-01-13 NOTE — Progress Notes (Signed)
Pt did not come to appointment today. Called pt and he does not want to reschedule at this time due to being too busy. Pt will call back when he's ready to reschedule appointment.

## 2018-01-19 ENCOUNTER — Encounter: Payer: Self-pay | Admitting: Dietician

## 2018-01-28 ENCOUNTER — Telehealth: Payer: Self-pay

## 2018-01-28 NOTE — Telephone Encounter (Signed)
[facility] been [facility] to [facility] to attention GaryKelly.

## 2018-01-28 NOTE — Telephone Encounter (Signed)
Please see the message below and decide if it is ok to send the requested information.   Copied from CRM [phone]. Topic: Medical Record Request - Provider/Facility Request >> Jan 28, 2018  9:49 AM Arlyss Gandyichardson, Taren N, NT wrote: Patient Name/DOB/MRN #: William Valdez,William Valdez/ [id number]/ [birth year] Requestor Name/Agency: [facility] Call Back #: [phone] Information Requested: Requesting fax of pts last A1C, lab work and office note for this pt. Pt was seen with them [place] 12[place]. Tresa EndoKelly: Fax#: [phone]   Route to [facility] Pool for [facility]. For all other clinics, route to the clinic's PEC Pool.

## 2018-02-27 ENCOUNTER — Ambulatory Visit: Payer: 59 | Admitting: Nurse Practitioner

## 2018-02-27 ENCOUNTER — Encounter: Payer: Self-pay | Admitting: Nurse Practitioner

## 2018-02-27 VITALS — BP 128/82 | HR 89 | Temp 98.6°F | Resp 18 | Ht 69.0 in | Wt 214.4 lb

## 2018-02-27 DIAGNOSIS — M5442 Lumbago with sciatica, left side: Secondary | ICD-10-CM | POA: Diagnosis not present

## 2018-02-27 MED ORDER — TIZANIDINE HCL 2 MG PO CAPS: 2.0000 mg | ORAL_CAPSULE | Freq: Three times a day (TID) | ORAL | 0 refills | Status: AC

## 2018-02-27 NOTE — Progress Notes (Addendum)
Name: William Valdez   MRN: [id number]    DOB: 1981-07-01   Date:02/27/2018       Progress Note  Subjective  Chief Complaint  Chief Complaint  Patient presents with  . Back Pain    low back pain for 3 weeks    HPI  Patient endorses lbp ongoing for the past 3 months and in the last 2-3 week shooting down left leg. Patient does cross fit and states unable to do this week because pain has worsened. Denies injury. Has tried heat massager with some relief and stretching- didn't help at all.  Denies bowel/bladder incontinence.   Patient Active Problem List   Diagnosis Date Noted  . Chronic left shoulder pain 11/12/2017  . Constipation 11/12/2017  . Type 1 diabetes mellitus with hyperglycemia (HCC) 08/08/2017  . 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 Acne vulgaris 10/25/2015  . Noncompliance w/medication treatment due to intermit use of medication 08/23/2015  . DM (diabetes mellitus), type 1, uncontrolled, with renal complications (HCC) 06/23/2015  . Neutropenia (HCC) 03/24/2015  . 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 Hypertension goal BP (blood pressure) < 140/90 03/24/2015  . Obesity, Class I, BMI 30.0-34.9 (see actual BMI) 03/22/2015  . CKD (chronic kidney disease), 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  . Vitamin D deficiency 03/22/2015    Past Medical History:  Diagnosis Date  . Chronic kidney disease   . Diabetes mellitus without complication (HCC)   . Hypertension goal BP (blood pressure) < 140/90 03/24/2015    Past Surgical History:  Procedure Laterality Date  . SHOULDER SURGERY Right     Social History   Tobacco Use  . Smoking status: Never Smoker  . Smokeless tobacco: Never Used  Substance Use Topics  . Alcohol use: No    Alcohol/week: 0.0 oz     Current Outpatient Medications:  .  BD ULTRA-FINE PEN NEEDLES 29G X 12.7MM MISC, USE AS DIRECTED TO INJECT INSULIN 2X/DAY, Disp: 180 each, Rfl:  3 .  Insulin Glargine (TOUJEO SOLOSTAR) 300 UNIT/ML SOPN, Inject 36 Units into the skin daily., Disp: , Rfl:  .  insulin lispro (HUMALOG) 100 UNIT/ML KiwkPen, Give 8-20 units with meals three times a day, Disp: , Rfl:  .  losartan-hydrochlorothiazide (HYZAAR) 100-12.5 MG tablet, TAKE 1 TABLET BY MOUTH EVERY DAY, Disp: 30 tablet, Rfl: 5 .  glucagon (GLUCAGON EMERGENCY) 1 MG injection, Inject 1 mg into the vein once as needed for up to 1 dose. (Patient not taking: Reported on 12/01/2017), Disp: 1 each, Rfl: 12 .  glucose blood (ACCU-CHEK AVIVA PLUS) test strip, 1 each by Other route 3 (three) times daily. (Patient not taking: Reported on 12/01/2017), Disp: 100 each, Rfl: 1  No Known Allergies  ROS   No other specific complaints in a complete review of systems (except as listed in HPI above).  Objective  Vitals:   02/27/18 0834  BP: 128/82  Pulse: 89  Resp: 18  Temp: 98.6 F (37 C)  TempSrc: Oral  SpO2: 94%  Weight: 214 lb 6.4 oz (97.3 kg)  Height:  (1.753 m)     Body mass index is 31.66 kg/m.  Nursing Note and Vital Signs reviewed.  Physical Exam   Constitutional: Patient appears well-developed and well-nourished.  No distress.  Cardiovascular: Normal rate Pulmonary/Chest: Effort normal . MSK: positve straight leg test  Psychiatric: Patient has a normal mood and affect. behavior is normal. Judgment and thought content  normal.  No results found for this or any previous visit (from the past 72 hour(s)).  Assessment & Plan  1. Acute bilateral low back pain with left-sided sciatica  - tizanidine (ZANAFLEX) 2 MG capsule; Take 1 capsule (2 mg total) by mouth 3 (three) times daily.  Dispense: 30 capsule; Refill: 0  - Use ice/heat 4-5 times a day for 20 minutes - Stretches - Muscle relaxer three times a day as needed for pain - If needed use ibuprofen or naproxen over the counter very sparingly for back pain  ibuprofen  twice a day for one week -  Chiropracter  -Red flags and when to present for emergency care bowel/bladder incontinence new or worsening symptoms, reviewed with patietn . Follow up and care instructions discussed and provided in AVS. -Reviewed Health Maintenance: declined tdap   ----------------------------------------------- I have reviewed this encounter including the documentation in this note and/or discussed this patient with the provider, Sharyon Cable DNP AGNP-C. I am certifying that I agree with the content of this note as supervising physician. Baruch Gouty, MD [facility] 03/03/2018, 1:25 PM

## 2018-02-27 NOTE — Patient Instructions (Addendum)
- Use ice/heat 4-5 times a day for 20 minutes - Stretches - Muscle relaxer three times a day as needed for pain - If needed use ibuprofen or naproxen over the counter very sparingly for back pain  ibuprofen  twice a day for one week - Chiropracter  Sciatica Sciatica is pain, numbness, weakness, or tingling along your sciatic nerve. The sciatic nerve starts in the lower back and goes down the back of each leg. Sciatica happens when this nerve is pinched or has pressure put on it. Sciatica usually goes away on its own or with treatment. Sometimes, sciatica may keep coming back (recur). Follow these instructions at home: Medicines  Take over-the-counter and prescription medicines only as told by your doctor.  Do not drive or use heavy machinery while taking prescription pain medicine. Managing pain  If directed, put ice on the affected area. ? Put ice in a plastic bag. ? Place a towel between your skin and the bag. ? Leave the ice on for 20 minutes, 2-3 times a day.  After icing, apply heat to the affected area before you exercise or as often as told by your doctor. Use the heat source that your doctor tells you to use, such as a moist heat pack or a heating pad. ? Place a towel between your skin and the heat source. ? Leave the heat on for 20-30 minutes. ? Remove the heat if your skin turns bright red. This is especially important if you are unable to feel pain, heat, or cold. You may have a greater risk of getting burned. Activity  Return to your normal activities as told by your doctor. Ask your doctor what activities are safe for you. ? Avoid activities that make your sciatica worse.  Take short rests during the day. Rest in a lying or standing position. This is usually better than sitting to rest. ? When you rest for a long time, do some physical activity or stretching between periods of rest. ? Avoid sitting for a long time without moving. Get up and move around at least one  time each hour.  Exercise and stretch regularly, as told by your doctor.  Do not lift anything that is heavier than 10 lb (4.5 kg) while you have symptoms of sciatica. ? Avoid lifting heavy things even when you do not have symptoms. ? Avoid lifting heavy things over and over.  When you lift objects, always lift in a way that is safe for your body. To do this, you should: ? Bend your knees. ? Keep the object close to your body. ? Avoid twisting. General instructions  Use good posture. ? Avoid leaning forward when you are sitting. ? Avoid hunching over when you are standing.  Stay at a healthy weight.  Wear comfortable shoes that support your feet. Avoid wearing high heels.  Avoid sleeping on a mattress that is too soft or too hard. You might have less pain if you sleep on a mattress that is firm enough to support your back.  Keep all follow-up visits as told by your doctor. This is important. Contact a doctor if:  You have pain that: ? Wakes you up when you are sleeping. ? Gets worse when you lie down. ? Is worse than the pain you have had in the past. ? Lasts longer than 4 weeks.  You lose weight for without trying. Get help right away if:  You cannot control when you pee (urinate) or poop (have a bowel movement).  You have weakness in any of these areas and it gets worse. ? Lower back. ? Lower belly (pelvis). ? Butt (buttocks). ? Legs.  You have redness or swelling of your back.  You have a burning feeling when you pee. This information is not intended to replace advice given to you by your health care provider. Make sure you discuss any questions you have with your health care provider. Document Released: 07/02/2008 Document Revised: 02/29/2016 Document Reviewed: 06/02/2015 Elsevier Interactive Patient Education  2018 [employer]. Ten sciatica stretches that you can do anytime, anywhere

## 2018-04-26 ENCOUNTER — Other Ambulatory Visit: Payer: Self-pay | Admitting: Family Medicine

## 2018-04-27 NOTE — Telephone Encounter (Signed)
Patient has an appt for August 2nd with Antoine PrimasZachary Smith; is he changing primary doctors? Thank you

## 2018-05-05 ENCOUNTER — Other Ambulatory Visit: Payer: Self-pay | Admitting: Family Medicine

## 2018-05-05 MED ORDER — LOSARTAN POTASSIUM-HCTZ 100-12.5 MG PO TABS: 1.0000 | ORAL_TABLET | Freq: Every day | ORAL | 5 refills | Status: AC

## 2018-05-05 NOTE — Telephone Encounter (Signed)
Cr and K+ reviewed Rx approved 

## 2018-05-05 NOTE — Telephone Encounter (Signed)
Copied from CRM [id number]. Topic: Quick Communication - Rx Refill/Question >> May 05, 2018 12:53 PM Alexander BergeronBarksdale, Harvey B wrote: Medication: losartan-hydrochlorothiazide (HYZAAR) 100-12.5 MG tablet [[id number]][[id number]]   Has the patient contacted their pharmacy? Yes.   (Agent: If no, request that the patient contact the pharmacy for the refill.) (Agent: If yes, when and what did the pharmacy advise?)  Preferred Pharmacy (with phone number or street name): [facility]  Agent: Please be advised that RX refills may take up to 3 business days. We ask that you follow-up with your pharmacy.

## 2018-05-08 ENCOUNTER — Encounter: Payer: Self-pay | Admitting: Family Medicine

## 2018-05-08 ENCOUNTER — Ambulatory Visit: Payer: PRIVATE HEALTH INSURANCE | Admitting: Family Medicine

## 2018-05-08 ENCOUNTER — Ambulatory Visit (INDEPENDENT_AMBULATORY_CARE_PROVIDER_SITE_OTHER)
Admission: RE | Admit: 2018-05-08 | Discharge: 2018-05-08 | Disposition: A | Discharge status: Home / Self Care | Payer: PRIVATE HEALTH INSURANCE | Source: Ambulatory Visit | Attending: Family Medicine | Admitting: Family Medicine

## 2018-05-08 VITALS — BP 126/90 | HR 61 | Ht 69.0 in | Wt 218.0 lb

## 2018-05-08 DIAGNOSIS — M5416 Radiculopathy, lumbar region: Secondary | ICD-10-CM

## 2018-05-08 DIAGNOSIS — G8929 Other chronic pain: Secondary | ICD-10-CM

## 2018-05-08 DIAGNOSIS — M549 Dorsalgia, unspecified: Secondary | ICD-10-CM

## 2018-05-08 HISTORY — DX: Radiculopathy, lumbar region: M54.16

## 2018-05-08 MED ORDER — GABAPENTIN 100 MG PO CAPS: 200.0000 mg | ORAL_CAPSULE | Freq: Every day | ORAL | 3 refills | Status: DC

## 2018-05-08 MED ORDER — PREDNISONE 50 MG PO TABS: 50.0000 mg | ORAL_TABLET | Freq: Every day | ORAL | 0 refills | Status: AC

## 2018-05-08 NOTE — Assessment & Plan Note (Signed)
Patient has having true sciatica.  Left leg radiculopathy.  X-rays pending, discussed icing regimen, short course of prednisone and warned to monitor blood sugars with patient being a diabetic.  Patient is also going to do gabapentin.  Small home exercises given at the time.  Follow-up again in 2 weeks.  Worsening symptoms including weakness or constant numbness will need to consider advanced imaging.  Patient is in agreement with the plan.

## 2018-05-08 NOTE — Patient Instructions (Addendum)
Good to see you  William Valdez is your friend. Ice 20 minutes 2 times daily. Usually after activity and before bed. Xray downstairs Careful with lifting.  Prednisone daily for 5 days and watch the blood sugars  Gabapentin 200mg  at night Tylenol 650 mg 3 times a day  See me again in 10-14 days

## 2018-05-08 NOTE — Progress Notes (Signed)
William Valdez D.O. [facility] [address] [place], [place] [address] Phone: [phone] Subjective:    I'm seeing this patient by the request  of:  Lada, Janit BernMelinda P, MD   CC: Back pain   BJY:[id number]:Subjective  William BatmanRonnie C Valdez is a 37 y.o. male coming in with complaint of lower back pain. Left sided pain. Pain radiates to the back of his knee.    Onset- 3 months  Location- Lower back Duration- Worse at night to early morning Character- sharp, dull, achy  Aggravating factors- flexion, carrying things  Reliving factors- Topical Severity-6 out of 10 in severity and worsening     Past Medical History:  Diagnosis Date  . Chronic kidney disease   . Diabetes mellitus without complication (HCC)   . Hypertension goal BP (blood pressure) < 140/90 03/24/2015   Past Surgical History:  Procedure Laterality Date  . SHOULDER SURGERY Right    Social History   Socioeconomic History  . Marital status: Single    Spouse name: Not on file  . Number of children: 1  . Years of education: Not on file  . Highest education level: Not on file  Occupational History  . Occupation: Event organiserManager    Employer: [employer]  Social Needs  . Financial resource strain: Not on file  . Food insecurity:    Worry: Not on file    Inability: Not on file  . Transportation needs:    Medical: Not on file    Non-medical: Not on file  Tobacco Use  . Smoking status: Never Smoker  . Smokeless tobacco: Never Used  Substance and Sexual Activity  . Alcohol use: No    Alcohol/week: 0.0 oz  . Drug use: No  . Sexual activity: Yes    Partners: Female    Birth control/protection: None  Lifestyle  . Physical activity:    Days per week: Not on file    Minutes per session: Not on file  . Stress: Not on file  Relationships  . Social connections:    Talks on phone: Not on file    Gets together: Not on file    Attends religious service: Not on file    Active member of club or organization: Not on file   Attends meetings of clubs or organizations: Not on file    Relationship status: Not on file  Other Topics Concern  . Not on file  Social History Narrative  . Not on file   No Known Allergies Family History  Problem Relation Age of Onset  . Heart disease Mother   . Cancer Mother        breast  . Heart disease Father      Past medical history, social, surgical and family history all reviewed in electronic medical record.  No pertanent information unless stated regarding to the chief complaint.   Review of Systems:Review of systems updated and as accurate as of 05/08/18  No headache, visual changes, nausea, vomiting, diarrhea, constipation, dizziness, abdominal pain, skin rash, fevers, chills, night sweats, weight loss, swollen lymph nodes, body aches, joint swelling, chest pain, shortness of breath, mood changes.  Positive muscle aches  Objective  Blood pressure 126/90, pulse 61, height 5\' 9"  (1.753 m), weight 218 lb (98.9 kg), SpO2 99 %. Systems examined below as of 05/08/18   General: No apparent distress alert and oriented x3 mood and affect normal, dressed appropriately.  HEENT: Pupils equal, extraocular movements intact  Respiratory: Patient's speak in full sentences and does  not appear short of breath  Cardiovascular: No lower extremity edema, non tender, no erythema  Skin: Warm dry intact with no signs of infection or rash on extremities or on axial skeleton.  Abdomen: Soft nontender  Neuro: Cranial nerves II through XII are intact, neurovascularly intact in all extremities with 2+ DTRs and 2+ pulses.  Lymph: No lymphadenopathy of posterior or anterior cervical chain or axillae bilaterally.  Gait normal with good balance and coordination.  MSK:  Non tender with full range of motion and good stability and symmetric strength and tone of shoulders, elbows, wrist, hip, knee and ankles bilaterally.  Back Exam:  Inspection: Loss of lordosis Motion: Flexion 25 deg, Extension 25  deg, Side Bending to 35 deg bilaterally,  Rotation to 35 deg bilaterally  SLR laying: Positive left at 25 degrees flexion XSLR laying: Positive left Palpable tenderness: Tender to palpation in the paraspinal musculature at L5-S1 on the left side. FABER: negative. Sensory change: Gross sensation intact to all lumbar and sacral dermatomes.  Reflexes: 2+ at both patellar tendons, 2+ at achilles tendons, Babinski's downgoing.  Strength at foot  Plantar-flexion: 5/5 Dorsi-flexion: 5/5 Eversion: 5/5 Inversion: 5/5  Leg strength  Quad: 5/5 Hamstring: 5/5 Hip flexor: 5/5 Hip abductors: 5/5  Gait unremarkable.    Impression and Recommendations:     This case required medical decision making of moderate complexity.      Note: This dictation was prepared with Dragon dictation along with smaller phrase technology. Any transcriptional errors that result from this process are unintentional.

## 2018-05-21 NOTE — Progress Notes (Signed)
Tawana ScaleZach Ahyana Skillin D.O. [facility] [address] [place], [place] [address] Phone: [phone] Subjective:    I'm seeing this patient by the request  of:    CC: Back pain follow-up  LOV:[id number]:Subjective  William Valdez is a 37 y.o. male coming in with complaint of back pain. States his back is better than the last visit but he is still experiencing pain.  Patient was found to have more of a lumbar radiculopathy.  States that the radicular symptoms have completely resolved at this time.  Continues to have back pain.  Would state that he is about 50 to 55% better.  Patient was on the prednisone initially which did help significantly but patient did have some difficulty with his blood sugars.      Past Medical History:  Diagnosis Date  . Chronic kidney disease   . Diabetes mellitus without complication (HCC)   . Hypertension goal BP (blood pressure) < 140/90 03/24/2015  . Left lumbar radiculopathy 05/08/2018   Past Surgical History:  Procedure Laterality Date  . SHOULDER SURGERY Right    Social History   Socioeconomic History  . Marital status: Single    Spouse name: Not on file  . Number of children: 1  . Years of education: Not on file  . Highest education level: Not on file  Occupational History  . Occupation: Event organiserManager    Employer: [employer]  Social Needs  . Financial resource strain: Not on file  . Food insecurity:    Worry: Not on file    Inability: Not on file  . Transportation needs:    Medical: Not on file    Non-medical: Not on file  Tobacco Use  . Smoking status: Never Smoker  . Smokeless tobacco: Never Used  Substance and Sexual Activity  . Alcohol use: No    Alcohol/week: 0.0 standard drinks  . Drug use: No  . Sexual activity: Yes    Partners: Female    Birth control/protection: None  Lifestyle  . Physical activity:    Days per week: Not on file    Minutes per session: Not on file  . Stress: Not on file  Relationships  . Social connections:    Talks  on phone: Not on file    Gets together: Not on file    Attends religious service: Not on file    Active member of club or organization: Not on file    Attends meetings of clubs or organizations: Not on file    Relationship status: Not on file  Other Topics Concern  . Not on file  Social History Narrative  . Not on file   No Known Allergies Family History  Problem Relation Age of Onset  . Heart disease Mother   . Cancer Mother        breast  . Heart disease Father      Past medical history, social, surgical and family history all reviewed in electronic medical record.  No pertanent information unless stated regarding to the chief complaint.   Review of Systems:Review of systems updated and as accurate as of 05/22/18  No headache, visual changes, nausea, vomiting, diarrhea, constipation, dizziness, abdominal pain, skin rash, fevers, chills, night sweats, weight loss, swollen lymph nodes, body aches, joint swelling, muscle aches, chest pain, shortness of breath, mood changes.   Objective  Blood pressure 140/80, pulse 79, height 5\' 9"  (1.753 m), weight 207 lb (93.9 kg), SpO2 94 %. Systems examined below as of 05/22/18  General: No apparent distress alert and oriented x3 mood and affect normal, dressed appropriately.  HEENT: Pupils equal, extraocular movements intact  Respiratory: Patient's speak in full sentences and does not appear short of breath  Cardiovascular: No lower extremity edema, non tender, no erythema  Skin: Warm dry intact with no signs of infection or rash on extremities or on axial skeleton.  Abdomen: Soft nontender  Neuro: Cranial nerves II through XII are intact, neurovascularly intact in all extremities with 2+ DTRs and 2+ pulses.  Lymph: No lymphadenopathy of posterior or anterior cervical chain or axillae bilaterally.  Gait normal with good balance and coordination.  MSK:  Non tender with full range of motion and good stability and symmetric strength and tone  of shoulders, elbows, wrist, hip, knee and ankles bilaterally.  Back Exam:  Inspection: Unremarkable  Motion: Flexion 45 deg, Extension 25 deg, Side Bending to 35 deg bilaterally,  Rotation to 45 deg bilaterally  SLR laying: Negative  XSLR laying: Negative  Palpable tenderness: Tender to palpation the paraspinal musculature lumbar spine. FABER: Tightness bilaterally. Sensory change: Gross sensation intact to all lumbar and sacral dermatomes.  Reflexes: 2+ at both patellar tendons, 2+ at achilles tendons, Babinski's downgoing.  Strength at foot  Plantar-flexion: 5/5 Dorsi-flexion: 5/5 Eversion: 5/5 Inversion: 5/5  Leg strength  Quad: 5/5 Hamstring: 5/5 Hip flexor: 5/5 Hip abductors: 5/5  Gait unremarkable.   Osteopathic findings T3 extended rotated and side bent right inhaled third rib T7 extended rotated and side bent left L2 flexed rotated and side bent right Sacrum right on right    Impression and Recommendations:     This case required medical decision making of moderate complexity.      Note: This dictation was prepared with Dragon dictation along with smaller phrase technology. Any transcriptional errors that result from this process are unintentional.

## 2018-05-22 ENCOUNTER — Encounter: Payer: Self-pay | Admitting: Family Medicine

## 2018-05-22 ENCOUNTER — Ambulatory Visit (INDEPENDENT_AMBULATORY_CARE_PROVIDER_SITE_OTHER): Payer: PRIVATE HEALTH INSURANCE | Admitting: Family Medicine

## 2018-05-22 DIAGNOSIS — M999 Biomechanical lesion, unspecified: Secondary | ICD-10-CM | POA: Diagnosis not present

## 2018-05-22 DIAGNOSIS — M5416 Radiculopathy, lumbar region: Secondary | ICD-10-CM

## 2018-05-22 NOTE — Assessment & Plan Note (Signed)
Radicular symptoms unremarkable She still has some tenderness in the back.  We discussed different treatment options.  Patient wanted to continue conservative therapy and attempted osteopathic manipulation today.  Patient tolerated it well.  Patient will continue follow-up with me again in 3 to 4 weeks.

## 2018-05-22 NOTE — Patient Instructions (Signed)
Good to see you  Ice is your friend Stay active  Exercises 3 times a week.  No ATV yet  See me again in 3-4 weeks

## 2018-05-22 NOTE — Assessment & Plan Note (Signed)
Decision today to treat with OMT was based on Physical Exam  After verbal consent patient was treated with HVLA, ME, FPR techniques in cervical, thoracic, lumbar and sacral areas  Patient tolerated the procedure well with improvement in symptoms  Patient given exercises, stretches and lifestyle modifications  See medications in patient instructions if given  Patient will follow up in 4-5 weeks 

## 2018-05-30 ENCOUNTER — Other Ambulatory Visit: Payer: Self-pay | Admitting: Family Medicine

## 2020-04-04 IMAGING — DX DG LUMBAR SPINE COMPLETE 4+V
5 series · 5 of 5 positions shown · non-contrast
Comparison: None.

CLINICAL DATA: Acute low back pain for 3 months with radiation into
LEFT leg. No known injury. Initial encounter.

EXAM:
LUMBAR SPINE - COMPLETE 4+ VIEW

[l-spine ap]
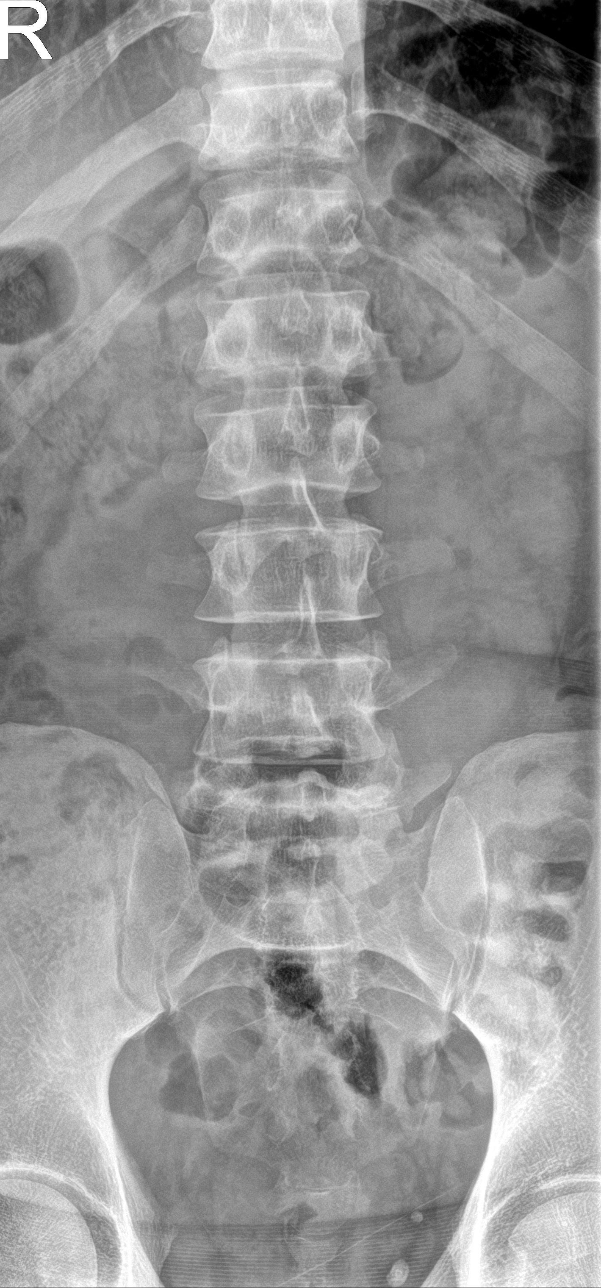

[l-spine obl (1 of 2)]
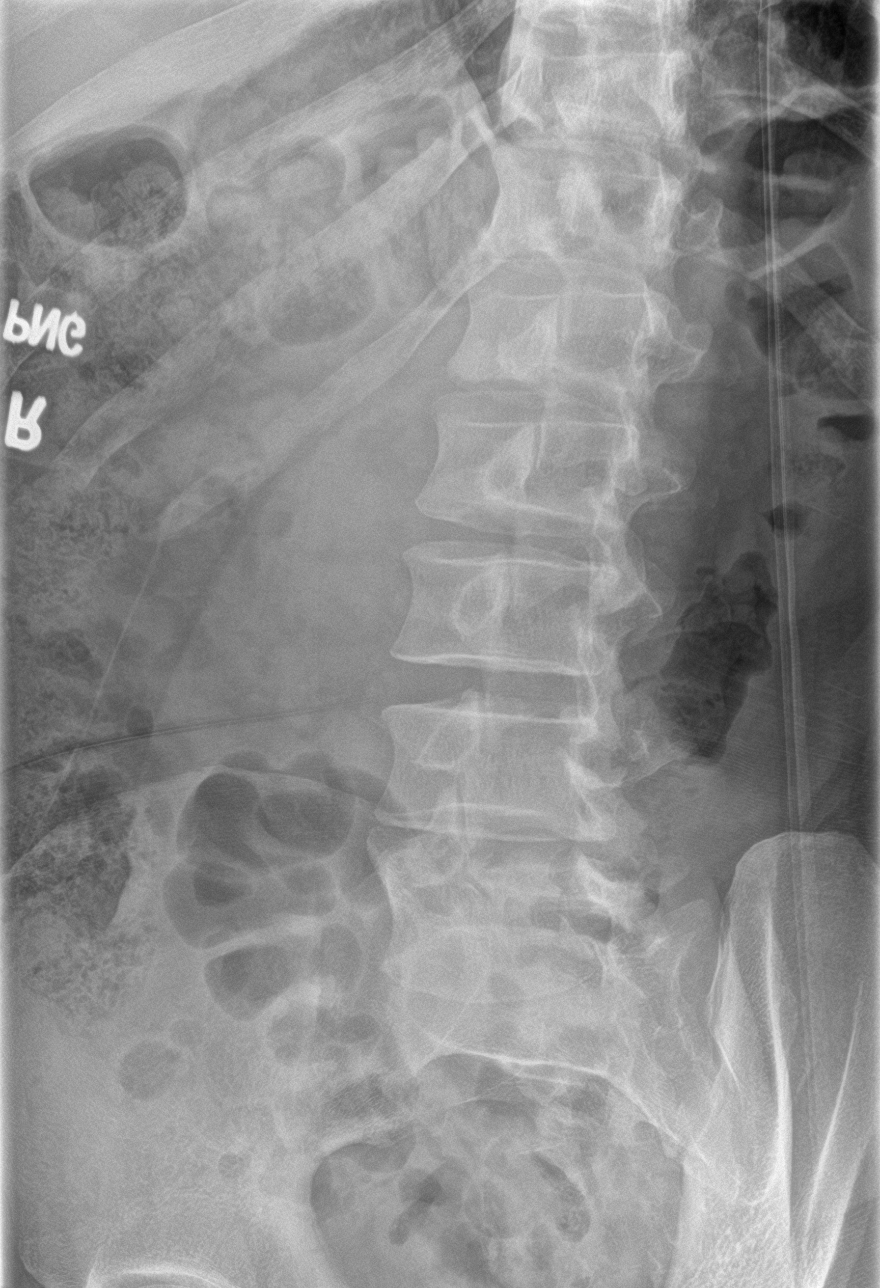

[l-spine obl (2 of 2)]
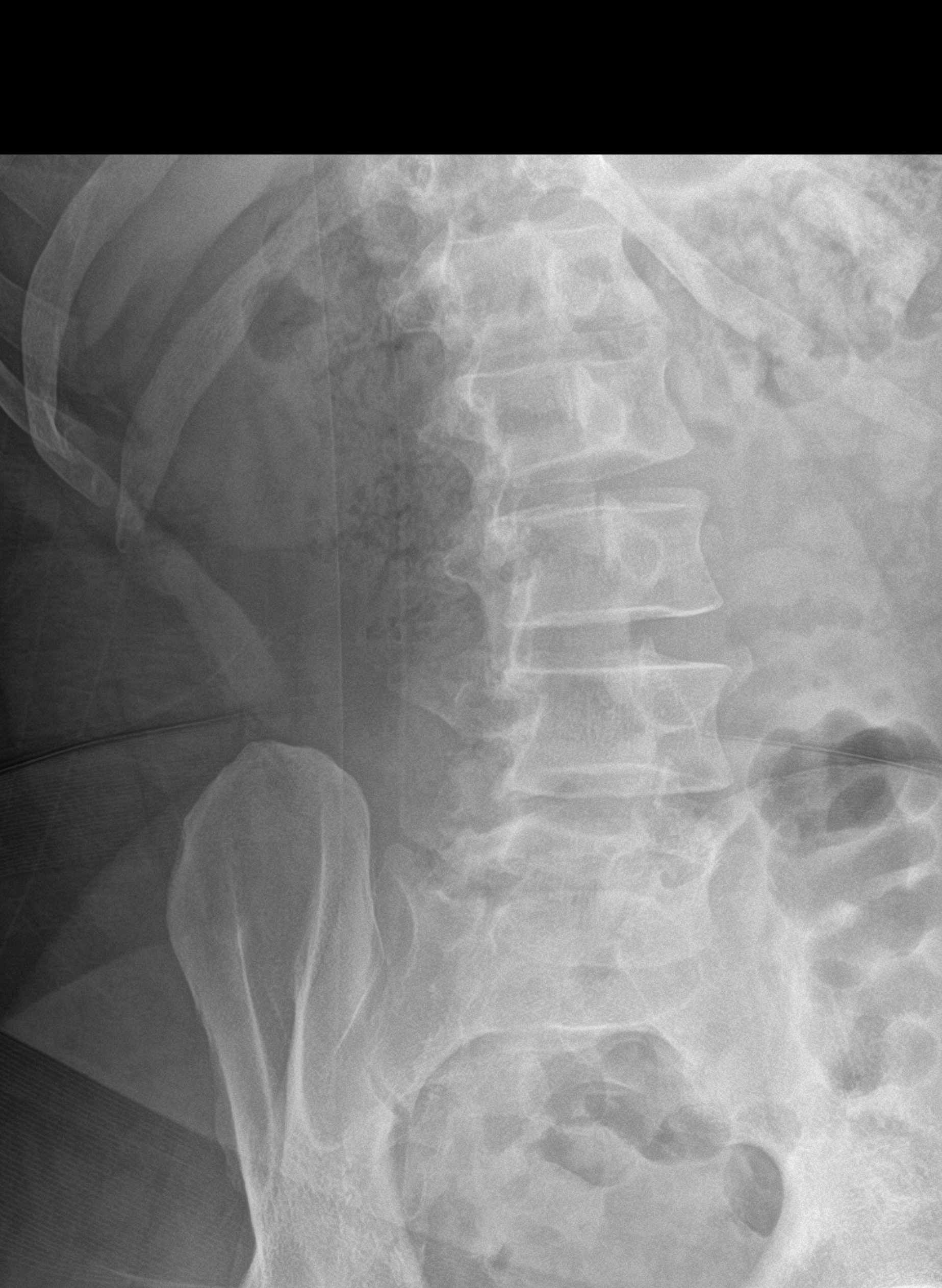

[l-spine lat]
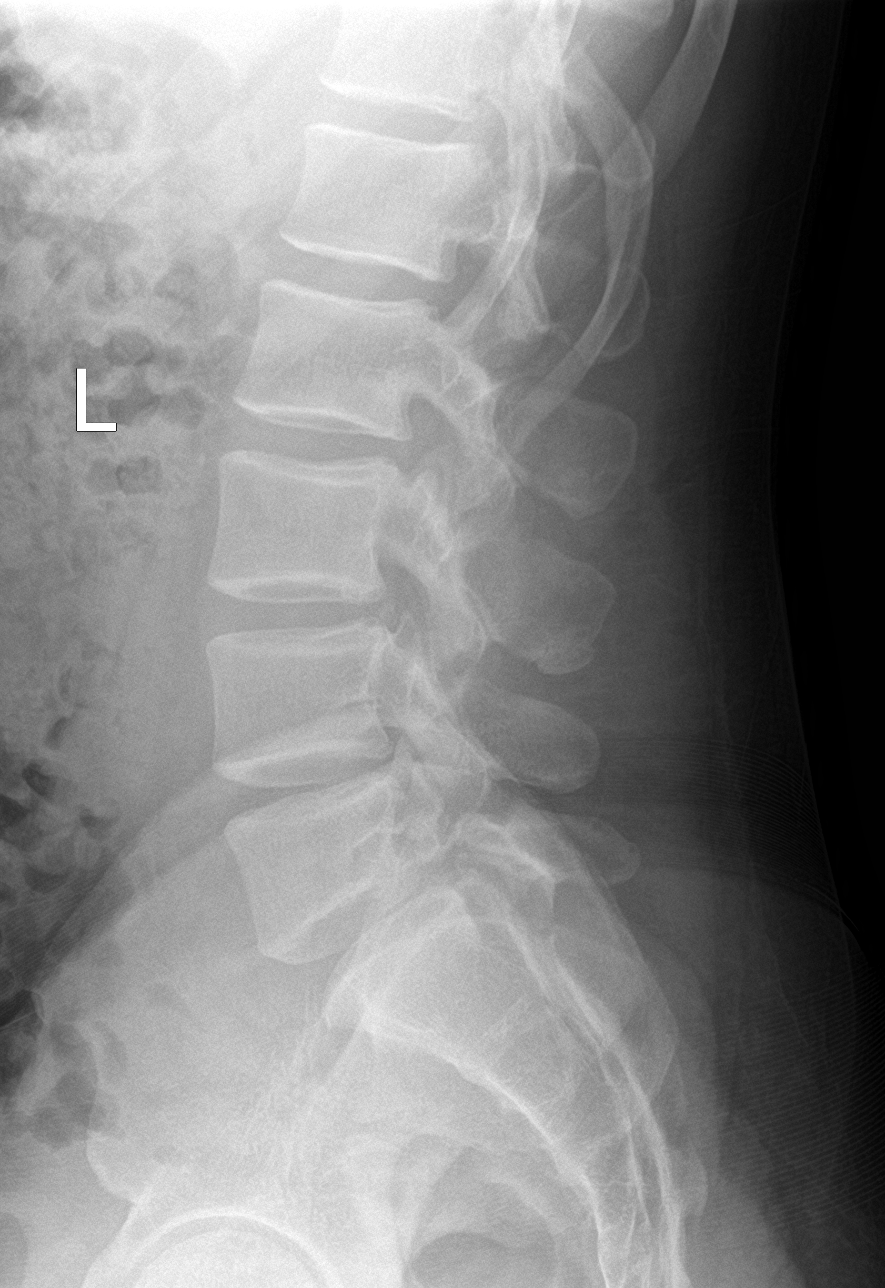

[l-spine spot]
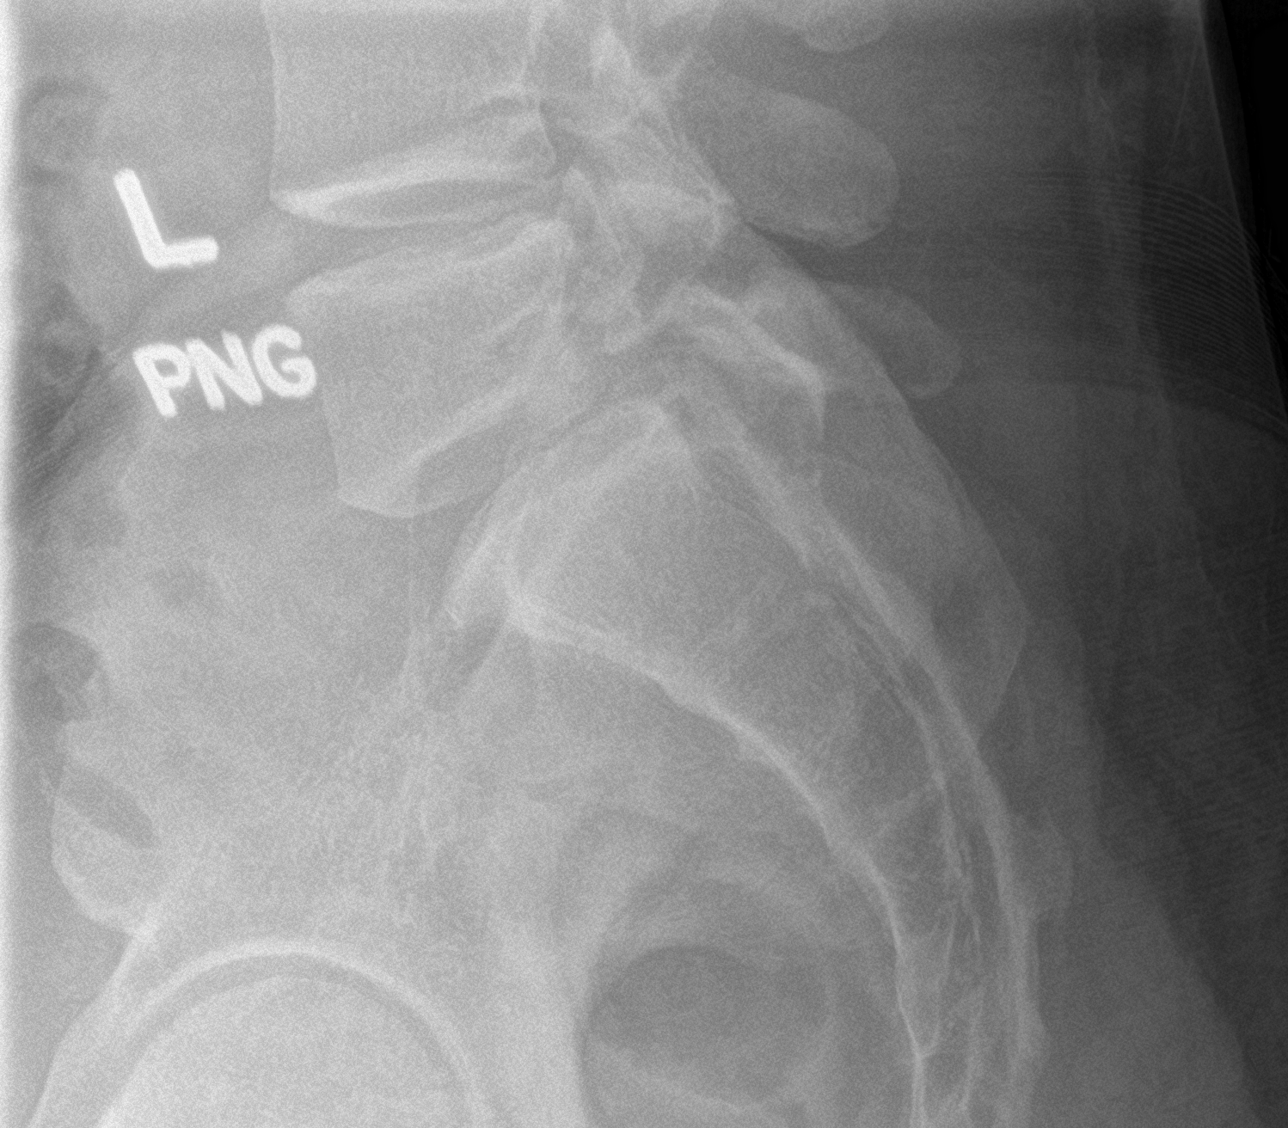

[5 of 5 positions shown; findings below may reference images not displayed]

FINDINGS: Five non rib-bearing lumbar type vertebra are identified.

Bilateral L5 pars defects noted with 7 mm anterolisthesis of L5 on
S1. Mild disc space narrowing and spondylosis at L5-S1 noted.

The remainder of the disc spaces are maintained.

No suspicious focal bony lesions noted.
IMPRESSION: Bilateral L5 pars defects with grade 1 anterolisthesis of L5 on S1.

No other significant abnormality.

## 2021-07-29 ENCOUNTER — Ambulatory Visit
Admission: EM | Admit: 2021-07-29 | Discharge: 2021-07-29 | Disposition: A | Discharge status: Home / Self Care | Payer: PRIVATE HEALTH INSURANCE | Attending: Emergency Medicine | Admitting: Emergency Medicine

## 2021-07-29 ENCOUNTER — Encounter: Payer: Self-pay | Admitting: Emergency Medicine

## 2021-07-29 ENCOUNTER — Other Ambulatory Visit: Payer: Self-pay

## 2021-07-29 DIAGNOSIS — S61012A Laceration without foreign body of left thumb without damage to nail, initial encounter: Secondary | ICD-10-CM

## 2021-07-29 DIAGNOSIS — I10 Essential (primary) hypertension: Secondary | ICD-10-CM

## 2021-07-29 DIAGNOSIS — Z23 Encounter for immunization: Secondary | ICD-10-CM | POA: Diagnosis not present

## 2021-07-29 MED ORDER — TETANUS-DIPHTH-ACELL PERTUSSIS 5-2.5-18.5 LF-MCG/0.5 IM SUSY
0.5000 mL | PREFILLED_SYRINGE | Freq: Once | INTRAMUSCULAR | Status: AC
  Administered 2021-07-29: 0.5 mL via INTRAMUSCULAR

## 2021-07-29 MED ORDER — CEPHALEXIN 500 MG PO CAPS: 500.0000 mg | ORAL_CAPSULE | Freq: Four times a day (QID) | ORAL | 0 refills | Status: AC

## 2021-07-29 NOTE — ED Provider Notes (Signed)
William Valdez    CSN: [id number] Arrival date & time: 07/29/21  1208      History   Chief Complaint Chief Complaint  Patient presents with   Laceration    HPI William Valdez is a 40 y.o. male.  Patient presents with a laceration on his left thumb that occurred yesterday >24 hours ago.  Bleeding controlled at the time of injury with direct pressure.  No active bleeding since.  He treated this at home with a bandage.  He denies numbness, weakness, paresthesias, drainage, fever, or other symptoms.  His medical history includes type 1 diabetes, hypertension, CKD.  Last tetanus unknown.    The history is provided by the patient and medical records.   Past Medical History:  Diagnosis Date   Chronic kidney disease    Diabetes mellitus without complication (HCC)    Hypertension goal BP (blood pressure) < 140/90 03/24/2015   Left lumbar radiculopathy 05/08/2018    Patient Active Problem List   Diagnosis Date Noted   Nonallopathic lesion of sacral region 05/22/2018   Nonallopathic lesion of lumbosacral region 05/22/2018   Nonallopathic lesion of thoracic region 05/22/2018   Left lumbar radiculopathy 05/08/2018   Chronic left shoulder pain 11/12/2017   Constipation 11/12/2017   Acne vulgaris 10/25/2015   Noncompliance w/medication treatment due to intermit use of medication 08/23/2015   DM (diabetes mellitus), type 1, uncontrolled, with renal complications 06/23/2015   Neutropenia (HCC) 03/24/2015   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 Authorizing Provider   cephALEXin (KEFLEX) 500 MG capsule Take 1 capsule (500 mg total) by mouth 4 (four) times daily. 07/29/21  Yes Mickie Bail, NP  BD ULTRA-FINE PEN NEEDLES 29G X 12.7MM MISC USE AS DIRECTED TO INJECT INSULIN 2X/DAY 04/20/15   Edwena Felty, MD  gabapentin (NEURONTIN) 100 MG capsule TAKE 2 CAPSULES (200 MG TOTAL) BY MOUTH AT BEDTIME. 06/01/18   Judi Saa, DO  glucagon (GLUCAGON EMERGENCY) 1 MG injection Inject 1 mg into the vein once as needed for up to 1 dose. 11/12/17   Lada, Janit Bern, MD  glucose blood (ACCU-CHEK AVIVA PLUS) test strip 1 each by Other route 3 (three) times daily. 11/26/15   Edwena Felty, MD  Insulin Glargine (TOUJEO SOLOSTAR) 300 UNIT/ML SOPN Inject 36 Units into the skin daily. 11/12/17   Kerman Passey, MD  insulin lispro (HUMALOG) 100 UNIT/ML KiwkPen Give 8-20 units with meals three times a day 11/12/17   Kerman Passey, MD  losartan-hydrochlorothiazide (HYZAAR) 100-12.5 MG tablet Take 1 tablet by mouth daily. 05/05/18   Kerman Passey, MD  predniSONE (DELTASONE) 50 MG tablet Take 1 tablet (50 mg total) by mouth daily. 05/08/18   Judi Saa, DO  tizanidine (ZANAFLEX) 2 MG capsule Take 1 capsule (2 mg total) by mouth 3 (three) times daily. 02/27/18   Poulose, Percell Belt, NP    Family History Family History  Problem Relation Age of Onset   Heart disease Mother    Cancer Mother        breast  Heart disease Father     Social History Social History   Tobacco Use   Smoking status: Never   Smokeless tobacco: Never  Vaping Use   Vaping Use: Never used  Substance Use Topics   Alcohol use: No    Alcohol/week: 0.0 standard drinks   Drug use: No     Allergies   Patient has no known allergies.   Review of Systems Review of Systems  Constitutional:  Negative for chills and fever.  Respiratory:  Negative for cough and shortness of breath.   Cardiovascular:  Negative for chest pain and palpitations.  Skin:  Positive for wound. Negative for color  change.  Neurological:  Negative for weakness and numbness.  All other systems reviewed and are negative.   Physical Exam Triage Vital Signs ED Triage Vitals  Enc Vitals Group     BP 07/29/21 1239 (!) 138/99     Pulse Rate 07/29/21 1239 93     Resp 07/29/21 1239 18     Temp 07/29/21 1239 97.7 F (36.5 C)     Temp Source 07/29/21 1239 Oral     SpO2 07/29/21 1239 97 %     Weight --      Height --      Head Circumference --      Peak Flow --      Pain Score 07/29/21 1246 4     Pain Loc --      Pain Edu? --      Excl. in GC? --    No data found.  Updated Vital Signs BP (!) 138/99 (BP Location: Left Arm)   Pulse 93   Temp 97.7 F (36.5 C) (Oral)   Resp 18   SpO2 97%   Visual Acuity Right Eye Distance:   Left Eye Distance:   Bilateral Distance:    Right Eye Near:   Left Eye Near:    Bilateral Near:     Physical Exam Vitals and nursing note reviewed.  Constitutional:      General: He is not in acute distress.    Appearance: He is well-developed.  HENT:     Head: Normocephalic and atraumatic.     Mouth/Throat:     Mouth: Mucous membranes are moist.  Eyes:     Conjunctiva/sclera: Conjunctivae normal.  Cardiovascular:     Rate and Rhythm: Normal rate and regular rhythm.     Heart sounds: Normal heart sounds.  Pulmonary:     Effort: Pulmonary effort is normal. No respiratory distress.     Breath sounds: Normal breath sounds.  Abdominal:     Palpations: Abdomen is soft.     Tenderness: There is no abdominal tenderness.  Musculoskeletal:        General: No deformity. Normal range of motion.     Cervical back: Neck supple.  Skin:    General: Skin is warm and dry.     Findings: Lesion present. No erythema.     Comments: 1.5 cm laceration on palmar side of left thumb.  No bleeding.  No drainage or erythema.    Neurological:     General: No focal deficit present.     Mental Status: He is alert and oriented to person, place, and time.     Sensory: No sensory  deficit.     Motor: No weakness.     Gait: Gait normal.  Psychiatric:        Mood and Affect: Mood normal.  Behavior: Behavior normal.     UC Treatments / Results  Labs (all labs ordered are listed, but only abnormal results are displayed) Labs Reviewed - No data to display  EKG   Radiology No results found.  Procedures Procedures (including critical care time)  Medications Ordered in UC Medications  Tdap (BOOSTRIX) injection 0.5 mL (0.5 mLs Intramuscular Given 07/29/21 1251)    Initial Impression / Assessment and Plan / UC Course  I have reviewed the triage vital signs and the nursing notes.  Pertinent labs & imaging results that were available during my care of the patient were reviewed by me and considered in my medical decision making (see chart for details).  Laceration of left thumb.  Elevated blood pressure with hypertension.   No sutures today because laceration is >24 hours old.  Patient is diabetic.  Treating with cephalexin.  Tetanus updated today.  Discussed wound care instructions and signs of infection.  Instructed patient to follow-up with his PCP or return here right away if he notes signs of infection.  Also discussed that his blood pressure is elevated today and needs to be rechecked by his PCP in 2 to 4 weeks.  Education provided on managing hypertension.  Patient agrees to plan of care.   Final Clinical Impressions(s) / UC Diagnoses   Final diagnoses:  Laceration of left thumb without foreign body without damage to nail, initial encounter  Elevated blood pressure reading in office with diagnosis of hypertension     Discharge Instructions      Your tetanus was updated today.  The antibiotic as directed.  Keep your wound clean and dry.  Wash it gently twice a day with soap and water; then apply an antibiotic ointment and bandage twice a day.  Follow-up with your primary care provider or return here if you note signs of infection such as pus,  redness, fever, or other concerns.  Your blood pressure is elevated today at 138/99.  Please have this rechecked by your primary care provider in 2-4 weeks.            ED Prescriptions     Medication Sig Dispense Auth. Provider   cephALEXin (KEFLEX) 500 MG capsule Take 1 capsule (500 mg total) by mouth 4 (four) times daily. 28 capsule Mickie Bail, NP      PDMP not reviewed this encounter.   Mickie Bail, NP 07/29/21 [phone]

## 2021-07-29 NOTE — ED Triage Notes (Signed)
Pt here with left thumb laceration occurring yesterday with a power tool. Bleeding controlled, but is 9 old. Tetanus needs to be updated.

## 2021-07-29 NOTE — Discharge Instructions (Addendum)
Your tetanus was updated today.  The antibiotic as directed.  Keep your wound clean and dry.  Wash it gently twice a day with soap and water; then apply an antibiotic ointment and bandage twice a day.  Follow-up with your primary care provider or return here if you note signs of infection such as pus, redness, fever, or other concerns.  Your blood pressure is elevated today at 138/99.  Please have this rechecked by your primary care provider in 2-4 weeks.

## 2021-10-02 ENCOUNTER — Ambulatory Visit: Payer: Self-pay

## 2021-10-02 ENCOUNTER — Other Ambulatory Visit: Payer: Self-pay

## 2021-10-02 ENCOUNTER — Ambulatory Visit (INDEPENDENT_AMBULATORY_CARE_PROVIDER_SITE_OTHER): Payer: PRIVATE HEALTH INSURANCE | Admitting: Orthopaedic Surgery

## 2021-10-02 ENCOUNTER — Encounter: Payer: Self-pay | Admitting: Orthopaedic Surgery

## 2021-10-02 VITALS — BP 140/98 | HR 86 | Ht 69.0 in | Wt 225.0 lb

## 2021-10-02 DIAGNOSIS — M25512 Pain in left shoulder: Secondary | ICD-10-CM

## 2021-10-02 DIAGNOSIS — M7542 Impingement syndrome of left shoulder: Secondary | ICD-10-CM

## 2021-10-02 DIAGNOSIS — G8929 Other chronic pain: Secondary | ICD-10-CM | POA: Diagnosis not present

## 2021-10-02 MED ORDER — LIDOCAINE HCL 1 % IJ SOLN
0.5000 mL | INTRAMUSCULAR | Status: AC | PRN
  Administered 2021-10-02: 17:00:00 .5 mL

## 2021-10-02 MED ORDER — BUPIVACAINE HCL 0.25 % IJ SOLN
4.0000 mL | INTRAMUSCULAR | Status: AC | PRN
  Administered 2021-10-02: 17:00:00 4 mL via INTRA_ARTICULAR

## 2021-10-02 MED ORDER — METHYLPREDNISOLONE ACETATE 40 MG/ML IJ SUSP
40.0000 mg | INTRAMUSCULAR | Status: AC | PRN
  Administered 2021-10-02: 17:00:00 40 mg via INTRA_ARTICULAR

## 2021-10-02 NOTE — Progress Notes (Signed)
Office Visit Note   Patient: William Valdez           Date of Birth: October 25, 1980           MRN: [id number] Visit Date: 10/02/2021              Requested by: Kerman Passey, MD No address on file PCP: Kerman Passey, MD   Assessment & Plan: Visit Diagnoses:  1. Chronic left shoulder pain     Plan: Left subacromial injection performed he will bump his insulin up checking sugars frequently to cover potential hyperglycemia from the injection with cortisone.  If he is having persistent problems he will call and we will need to proceed with MRI scan.  Hopefully will get some good relief we discussed weight lifting activities that are best to be avoided since that load the rotator cuff significantly.  He is already made many of these modifications.  He can follow-up with me has ongoing symptoms.  Follow-Up Instructions: Return in about 4 weeks (around 10/30/2021).   Orders:  Orders Placed This Encounter  Procedures   XR Shoulder Left   No orders of the defined types were placed in this encounter.     Procedures: Large Joint Inj: L subacromial bursa on 10/02/2021 4:30 PM Indications: pain Details: 22 G 1.5 in needle  Arthrogram: No  Medications: 4 mL bupivacaine 0.25 %; 40 mg methylPREDNISolone acetate 40 MG/ML; 0.5 mL lidocaine 1 % Outcome: tolerated well, no immediate complications Procedure, treatment alternatives, risks and benefits explained, specific risks discussed. Consent was given by the patient. Immediately prior to procedure a time out was called to verify the correct patient, procedure, equipment, support staff and site/side marked as required. Patient was prepped and draped in the usual sterile fashion.      Clinical Data: No additional findings.   Subjective: Chief Complaint  Patient presents with   Left Shoulder - Pain    HPI 40 year old male returns he is having problems with his left shoulder now off and on for 2 years worse in the last few months.   He is stopped doing [employer] he has trouble getting his arm up over his head.  He has been working out he has type 1 diabetes states his last A1c is less than 7.  Chart review last MIC is 8.5.  Patient states he not able to lift objects over his head with his left arm but can do it easily with the right arm which had rotator cuff repair and biceps tenodesis by Dr. Ophelia Charter 2017.  Review of Systems type 1 insulin-dependent diabetic all systems noncontributory.   Objective: Vital Signs: BP (!) 140/98    Pulse 86    Ht 5\' 9"  (1.753 m)    Wt 225 lb (102.1 kg)    BMI 33.23 kg/m   Physical Exam Constitutional:      Appearance: He is well-developed.  HENT:     Head: Normocephalic and atraumatic.     Right Ear: External ear normal.     Left Ear: External ear normal.  Eyes:     Pupils: Pupils are equal, round, and reactive to light.  Neck:     Thyroid: No thyromegaly.     Trachea: No tracheal deviation.  Cardiovascular:     Rate and Rhythm: Normal rate.  Pulmonary:     Effort: Pulmonary effort is normal.     Breath sounds: No wheezing.  Abdominal:     General: Bowel sounds are  normal.     Palpations: Abdomen is soft.  Musculoskeletal:     Cervical back: Neck supple.  Skin:    General: Skin is warm and dry.     Capillary Refill: Capillary refill takes less than 2 seconds.  Neurological:     Mental Status: He is alert and oriented to person, place, and time.  Psychiatric:        Behavior: Behavior normal.        Thought Content: Thought content normal.        Judgment: Judgment normal.    Ortho Exam full range of motion right shoulder no pain.  Left shoulder has great difficulty getting his hand up above his head level.  With elbow flexion and forward flexion he can reach his arm up over his head.  Pain coming back down.  Positive impingement pain with resisted supraspinatus testing long head of the biceps minimally tender.  Specialty Comments:  No specialty comments  available.  Imaging: No results found.   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) 03/24/2015   Essential hypertension 03/24/2015   Obesity, Class I, BMI 30.0-34.9 (see actual BMI) 03/22/2015   CKD (chronic kidney disease), 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   Vitamin D deficiency 03/22/2015   Past Medical History:  Diagnosis Date   Chronic kidney disease    Diabetes mellitus without complication (HCC)    Hypertension goal BP (blood pressure) < 140/90 03/24/2015   Left lumbar radiculopathy 05/08/2018    Family History  Problem Relation Age of Onset   Heart disease Mother    Cancer Mother        breast   Heart disease Father     Past Surgical History:  Procedure Laterality Date   SHOULDER SURGERY Right    Social History   Occupational History   Occupation: Event organiser: [employer]  Tobacco Use   Smoking status: Never   Smokeless tobacco: Never  Vaping Use   Vaping Use: Never used  Substance and Sexual Activity   Alcohol use: No    Alcohol/week: 0.0 standard drinks   Drug use: No   Sexual activity: Yes    Partners: Female    Birth control/protection: None

## 2024-09-15 ENCOUNTER — Encounter: Payer: Self-pay | Admitting: Emergency Medicine

## 2024-09-15 ENCOUNTER — Ambulatory Visit: Admission: EM | Admit: 2024-09-15 | Discharge: 2024-09-15 | Disposition: A | Discharge status: Home / Self Care

## 2024-09-15 DIAGNOSIS — J209 Acute bronchitis, unspecified: Secondary | ICD-10-CM

## 2024-09-15 MED ORDER — AZITHROMYCIN 250 MG PO TABS: 250.0000 mg | ORAL_TABLET | Freq: Every day | ORAL | 0 refills | Status: AC

## 2024-09-15 MED ORDER — ACETAMINOPHEN 325 MG PO TABS
650.0000 mg | ORAL_TABLET | Freq: Once | ORAL | Status: AC
  Administered 2024-09-15: 650 mg via ORAL

## 2024-09-15 NOTE — ED Provider Notes (Signed)
 [name]    CSN: [id number] Arrival date & time: 09/15/24  0801      History   Chief Complaint Chief Complaint  Patient presents with   Cough   Headache   Shortness of Breath    HPI William Valdez is a 43 y.o. male.   Patient presents for evaluation of nasal congestion, sore throat, primarily nonproductive cough, shortness of breath with coughing, intermittent wheezing, nausea without vomiting and intermittent headaches present for 2 weeks.  Endorses symptoms worsened 1 day ago with increased fatigue.  No known sick contacts prior.  Tolerable to food and liquids.  Was evaluated in a different urgent care approximately 1 weeks ago and prescribed amoxicillin for treatment of bronchitis, patient endorsing he still has approximately 7 days of medication was unable to pull a prescription for review.  Additionally taking Mucinex.   Past Medical History:  Diagnosis Date   Chronic kidney disease    Diabetes mellitus without complication (HCC)    Hypertension goal BP (blood pressure) < 140/90 03/24/2015   Left lumbar radiculopathy 05/08/2018    Patient Active Problem List   Diagnosis Date Noted   Nonallopathic lesion of sacral region 05/22/2018   Nonallopathic lesion of lumbosacral region 05/22/2018   Nonallopathic lesion of thoracic region 05/22/2018   Left lumbar radiculopathy 05/08/2018   Chronic left shoulder pain 11/12/2017   Constipation 11/12/2017   Acne vulgaris 10/25/2015   Noncompliance w/medication treatment due to intermit use of medication 08/23/2015   DM (diabetes mellitus), type 1, uncontrolled, with renal complications 06/23/2015   Neutropenia 03/24/2015   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 Authorizing Provider  hydrochlorothiazide (HYDRODIURIL) 25 MG tablet Take 25 mg by mouth daily. 10/23/22  Yes [provider]  Insulin Disposable Pump (OMNIPOD 5 [id number] PODS GEN 5) MISC Change insulin pump every 3 days 08/09/24  Yes [provider]  losartan  (COZAAR ) 100 MG tablet Take 100 mg by mouth daily. 07/16/22  Yes [provider]  BD ULTRA-FINE PEN NEEDLES 29G X 12.7MM MISC USE AS DIRECTED TO INJECT INSULIN 2X/DAY 04/20/15   Sundaram, Ashany, MD  cephALEXin  (KEFLEX ) 500 MG capsule Take 1 capsule (500 mg total) by mouth 4 (four) times daily. 07/29/21   Corlis Burnard DEL, NP  gabapentin  (NEURONTIN ) 100 MG capsule TAKE 2 CAPSULES (200 MG TOTAL) BY MOUTH AT BEDTIME. 06/01/18   Claudene Arthea HERO, DO  glucagon  (GLUCAGON  EMERGENCY) 1 MG injection Inject 1 mg into the vein once as needed for up to 1 dose. 11/12/17   Lada, Newell SQUIBB, MD  glucose blood (ACCU-CHEK AVIVA PLUS) test strip 1 each by Other route 3 (three) times daily. 11/26/15   Sundaram, Ashany, MD  Insulin Glargine  300 UNIT/ML SOPN Inject 36 Units into the skin daily. 11/12/17   Hinton Newell SQUIBB, MD  insulin lispro  (HUMALOG ) 100 UNIT/ML KiwkPen Give 8-20 units with meals three times a day 11/12/17   Hinton Newell SQUIBB, MD  losartan -hydrochlorothiazide (HYZAAR) 100-12.5 MG tablet Take 1 tablet by mouth daily. 05/05/18   Hinton Newell SQUIBB, MD  metFORMIN (GLUCOPHAGE-XR) 500 MG 24 hr tablet Take 500 mg by mouth 2 (two) times daily. 09/02/21   [provider]  predniSONE  (DELTASONE ) 50 MG tablet Take 1 tablet (50 mg total) by mouth daily. 05/08/18   Claudene Arthea HERO, DO  tizanidine  (ZANAFLEX ) 2 MG capsule Take 1 capsule (2 mg total) by mouth 3 (three) times daily. 02/27/18   Poulose, Almarie BRAVO, NP    Family History Family History  Problem Relation Age of Onset    Heart disease Mother    Cancer Mother        breast   Heart disease Father     Social History Social History   Tobacco Use   Smoking status: Never   Smokeless tobacco: Never  Vaping Use   Vaping status: Never Used  Substance Use Topics   Alcohol use: No    Alcohol/week: 0.0 standard drinks of alcohol   Drug use: No     Allergies   Patient has no known allergies.   Review of Systems Review of Systems  Constitutional: Negative.   HENT:  Positive for congestion and sore throat. Negative for dental problem, drooling, ear discharge, ear pain, facial swelling, hearing loss, mouth sores, nosebleeds, postnasal drip, rhinorrhea, sinus pressure, sinus pain, sneezing, tinnitus, trouble swallowing and voice change.   Respiratory:  Positive for cough, shortness of breath and wheezing. Negative for apnea, choking, chest tightness and stridor.   Gastrointestinal:  Positive for nausea. Negative for abdominal distention, abdominal pain, anal bleeding, blood in stool, constipation, diarrhea, rectal pain and vomiting.  Neurological:  Positive for headaches. Negative for dizziness, tremors, seizures, syncope, facial asymmetry, speech difficulty, weakness, light-headedness and numbness.     Physical Exam Triage Vital Signs ED Triage Vitals  Encounter Vitals Group     BP 09/15/24 0815 133/80     Girls Systolic BP Percentile --      Girls Diastolic BP Percentile --      Boys Systolic BP Percentile --      Boys Diastolic BP Percentile --      Pulse Rate 09/15/24 0815 (!) 103     Resp 09/15/24 0815 20     Temp 09/15/24 0815 (!) 100.6 F (38.1 C)     Temp Source 09/15/24 0815 Oral     SpO2 09/15/24 0815 96 %     Weight --      Height --      Head Circumference --      Peak Flow --      Pain Score 09/15/24 0819 0     Pain Loc --      Pain Education --      Exclude from Growth Chart --    No data found.  Updated Vital Signs BP 133/80 (BP Location: Left Arm)   Pulse (!) 103   Temp  (!) 100.6 F (38.1 C) (Oral)   Resp 20   SpO2 96%   Visual Acuity Right Eye Distance:   Left Eye Distance:   Bilateral Distance:    Right Eye Near:   Left Eye Near:    Bilateral Near:     Physical Exam HENT:     Head: Normocephalic.     Right Ear: Tympanic membrane, ear canal and external ear normal.     Left Ear: Tympanic membrane, ear canal and external ear normal.     Nose: Congestion present.     Mouth/Throat:     Pharynx: Posterior oropharyngeal erythema present. No oropharyngeal exudate.  Eyes:     Extraocular Movements: Extraocular movements intact.  Cardiovascular:     Rate and Rhythm: Normal rate and regular rhythm.     Pulses: Normal pulses.     Heart sounds: Normal heart sounds.  Pulmonary:     Effort: Pulmonary effort is normal.     Breath sounds: Normal breath sounds.  Neurological:     Mental Status: He is alert and oriented to person, place, and time.      UC Treatments / Results  Labs (all labs ordered are listed, but only abnormal results are displayed) Labs Reviewed - No data to display  EKG   Radiology No results found.  Procedures Procedures (including critical care time)  Medications Ordered in UC Medications  acetaminophen (TYLENOL) tablet 650 mg (650 mg Oral Given 09/15/24 0825)    Initial Impression / Assessment and Plan / UC Course  I have reviewed the triage vital signs and the nursing notes.  Pertinent labs & imaging results that were available during my care of the patient were reviewed by me and considered in my medical decision making (see chart for details).  Acute bronchitis  Fever of 100.6 with associated tachycardia noted in triage, given Tylenol, lungs are clear to auscultation and O2 saturation 96% on room air, and no signs of distress nor toxic appearing, stable for outpatient management, imaging deferred at this time, unsure of prescription of amoxicillin advised to continue taking as directed additionally adding on  azithromycin, offered steroid IM, declined, deferring use of oral steroids due to history of diabetes last A1c greater than 9, has availability of inhaler and declined prescription for cough medicine, recommend over-the-counter medications and nonpharmacological supportive care with follow-up with urgent care as needed if symptoms continue to persist Final Clinical Impressions(s) / UC Diagnoses   Final diagnoses:  None   Discharge Instructions   None    ED Prescriptions   None    PDMP not reviewed this encounter.   Teresa Shelba SAUNDERS, [place] 09/15/24 [phone]

## 2024-09-15 NOTE — Discharge Instructions (Signed)
 Evaluated for your cough shortness of breath and wheezing, you are currently taking antibiotics would recommend continuing amoxicillin as directed additionally we will prescribed azithromycin both of these are treatments for community-acquired pneumonia however if your symptoms persist despite completion of antibiotics or if your breathing worsens at any point would recommend you return for x-ray imaging  Take azithromycin as directed  Continue use of inhaler as needed for shortness of breath and wheezing    You can take Tylenol and/or Ibuprofen as needed for fever reduction and pain relief.   For cough: honey 1/2 to 1 teaspoon (you can dilute the honey in water or another fluid).  You can also use guaifenesin and dextromethorphan for cough. You can use a humidifier for chest congestion and cough.  If you don't have a humidifier, you can sit in the bathroom with the hot shower running.      For sore throat: try warm salt water gargles, cepacol lozenges, throat spray, warm tea or water with lemon/honey, popsicles or ice, or OTC cold relief medicine for throat discomfort.   For congestion: take a daily anti-histamine like Zyrtec, Claritin, and a oral decongestant, such as pseudoephedrine.  You can also use Flonase 1-2 sprays in each nostril daily.   It is important to stay hydrated: drink plenty of fluids (water, gatorade/powerade/pedialyte, juices, or teas) to keep your throat moisturized and help further relieve irritation/discomfort.

## 2024-09-15 NOTE — ED Triage Notes (Signed)
 Patient reports cough with green mucus, chest congestion, headache and SOB x 2 weeks. Patient was seen at [facility]  a week ago and was diagnosis with Bronchitis and ws place on Amoxicillin. Patient is still on Amoxicillin. Patient reports no improvement
